# Patient Record
Sex: Female | Born: 1950 | Race: White | Hispanic: No | Marital: Married | Smoking: Never smoker
Health system: Southern US, Community
[De-identification: ages and names within clinical notes are randomized; demographics above are authoritative.]

## PROBLEM LIST (undated history)

## (undated) DIAGNOSIS — F32A Depression, unspecified: Secondary | ICD-10-CM

## (undated) DIAGNOSIS — R112 Nausea with vomiting, unspecified: Secondary | ICD-10-CM

## (undated) DIAGNOSIS — Z87442 Personal history of urinary calculi: Secondary | ICD-10-CM

## (undated) DIAGNOSIS — T8859XA Other complications of anesthesia, initial encounter: Secondary | ICD-10-CM

## (undated) DIAGNOSIS — J45909 Unspecified asthma, uncomplicated: Secondary | ICD-10-CM

## (undated) DIAGNOSIS — C50919 Malignant neoplasm of unspecified site of unspecified female breast: Secondary | ICD-10-CM

## (undated) DIAGNOSIS — E039 Hypothyroidism, unspecified: Secondary | ICD-10-CM

## (undated) DIAGNOSIS — F329 Major depressive disorder, single episode, unspecified: Secondary | ICD-10-CM

## (undated) DIAGNOSIS — K219 Gastro-esophageal reflux disease without esophagitis: Secondary | ICD-10-CM

## (undated) DIAGNOSIS — T4145XA Adverse effect of unspecified anesthetic, initial encounter: Secondary | ICD-10-CM

## (undated) DIAGNOSIS — Z9889 Other specified postprocedural states: Secondary | ICD-10-CM

## (undated) DIAGNOSIS — F419 Anxiety disorder, unspecified: Secondary | ICD-10-CM

## (undated) HISTORY — PX: BREAST EXCISIONAL BIOPSY: SUR124

## (undated) HISTORY — PX: COLONOSCOPY: SHX174

---
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
## 1999-07-10 HISTORY — PX: BREAST BIOPSY: SHX20

## 2004-12-11 ENCOUNTER — Emergency Department: Payer: Self-pay | Admitting: Emergency Medicine

## 2004-12-11 ENCOUNTER — Other Ambulatory Visit: Payer: Self-pay

## 2008-07-19 ENCOUNTER — Ambulatory Visit: Payer: Self-pay | Admitting: Obstetrics and Gynecology

## 2008-11-19 ENCOUNTER — Ambulatory Visit: Payer: Self-pay | Admitting: Gastroenterology

## 2010-09-13 ENCOUNTER — Ambulatory Visit: Payer: Self-pay | Admitting: Cardiology

## 2011-03-10 ENCOUNTER — Inpatient Hospital Stay: Payer: Self-pay | Admitting: Specialist

## 2011-05-17 ENCOUNTER — Ambulatory Visit: Payer: Self-pay | Admitting: Obstetrics and Gynecology

## 2011-09-24 ENCOUNTER — Emergency Department: Payer: Self-pay | Admitting: Emergency Medicine

## 2011-09-24 LAB — BASIC METABOLIC PANEL
Anion Gap: 10 (ref 7–16)
Chloride: 108 mmol/L — ABNORMAL HIGH (ref 98–107)
Co2: 22 mmol/L (ref 21–32)
Creatinine: 0.83 mg/dL (ref 0.60–1.30)
EGFR (African American): >60
Sodium: 140 mmol/L (ref 136–145)

## 2011-09-24 LAB — URINALYSIS, COMPLETE
Bacteria: NONE SEEN
Bilirubin,UR: NEGATIVE
Glucose,UR: NEGATIVE mg/dL (ref 0–75)
Hyaline Cast: 1
Nitrite: NEGATIVE
Protein: NEGATIVE
RBC,UR: 23 /HPF (ref 0–5)
Specific Gravity: 1.013 (ref 1.003–1.030)
Squamous Epithelial: NONE SEEN
WBC UR: 2 /HPF (ref 0–5)

## 2011-09-24 LAB — CBC
HGB: 13.6 g/dL (ref 12.0–16.0)
MCH: 29 pg (ref 26.0–34.0)
MCHC: 33.4 g/dL (ref 32.0–36.0)
MCV: 87 fL (ref 80–100)
RDW: 14.7 % — ABNORMAL HIGH (ref 11.5–14.5)

## 2011-09-28 ENCOUNTER — Emergency Department: Payer: Self-pay | Admitting: Unknown Physician Specialty

## 2011-09-28 LAB — CBC
HGB: 13.1 g/dL (ref 12.0–16.0)
MCH: 29.4 pg (ref 26.0–34.0)
MCHC: 33.9 g/dL (ref 32.0–36.0)
MCV: 87 fL (ref 80–100)
Platelet: 283 10*3/uL (ref 150–440)
RBC: 4.46 10*6/uL (ref 3.80–5.20)
WBC: 12.5 10*3/uL — ABNORMAL HIGH (ref 3.6–11.0)

## 2011-09-28 LAB — URINALYSIS, COMPLETE
Bilirubin,UR: NEGATIVE
Leukocyte Esterase: NEGATIVE
Ph: 5 (ref 4.5–8.0)
Protein: 30
RBC,UR: 725 /HPF (ref 0–5)
Specific Gravity: 1.029 (ref 1.003–1.030)
Squamous Epithelial: <1

## 2011-09-29 LAB — COMPREHENSIVE METABOLIC PANEL
Albumin: 4.3 g/dL (ref 3.4–5.0)
Alkaline Phosphatase: 86 U/L (ref 50–136)
BUN: 15 mg/dL (ref 7–18)
Bilirubin,Total: 0.6 mg/dL (ref 0.2–1.0)
Creatinine: 1.01 mg/dL (ref 0.60–1.30)
Glucose: 126 mg/dL — ABNORMAL HIGH (ref 65–99)
Osmolality: 278 (ref 275–301)
Sodium: 138 mmol/L (ref 136–145)
Total Protein: 7.4 g/dL (ref 6.4–8.2)

## 2011-10-01 ENCOUNTER — Ambulatory Visit: Payer: Self-pay | Admitting: Urology

## 2011-10-04 ENCOUNTER — Ambulatory Visit: Payer: Self-pay | Admitting: Urology

## 2011-10-22 ENCOUNTER — Ambulatory Visit: Payer: Self-pay | Admitting: Urology

## 2011-11-06 ENCOUNTER — Ambulatory Visit: Payer: Self-pay | Admitting: Urology

## 2011-11-20 ENCOUNTER — Ambulatory Visit: Payer: Self-pay | Admitting: Urology

## 2014-07-09 DIAGNOSIS — Z923 Personal history of irradiation: Secondary | ICD-10-CM

## 2014-07-09 DIAGNOSIS — C50919 Malignant neoplasm of unspecified site of unspecified female breast: Secondary | ICD-10-CM

## 2014-07-09 DIAGNOSIS — Z9221 Personal history of antineoplastic chemotherapy: Secondary | ICD-10-CM

## 2014-07-09 HISTORY — DX: Personal history of irradiation: Z92.3

## 2014-07-09 HISTORY — DX: Malignant neoplasm of unspecified site of unspecified female breast: C50.919

## 2014-07-09 HISTORY — DX: Personal history of antineoplastic chemotherapy: Z92.21

## 2014-12-08 ENCOUNTER — Other Ambulatory Visit: Payer: Self-pay | Admitting: Obstetrics and Gynecology

## 2014-12-08 DIAGNOSIS — N63 Unspecified lump in unspecified breast: Secondary | ICD-10-CM

## 2014-12-09 ENCOUNTER — Ambulatory Visit
Admission: RE | Admit: 2014-12-09 | Discharge: 2014-12-09 | Disposition: A | Discharge status: Home / Self Care | Payer: Federal, State, Local not specified - PPO | Source: Ambulatory Visit | Attending: Obstetrics and Gynecology | Admitting: Obstetrics and Gynecology

## 2014-12-09 ENCOUNTER — Other Ambulatory Visit: Payer: Self-pay | Admitting: Obstetrics and Gynecology

## 2014-12-09 ENCOUNTER — Ambulatory Visit: Payer: Self-pay

## 2014-12-09 DIAGNOSIS — N63 Unspecified lump in unspecified breast: Secondary | ICD-10-CM

## 2014-12-09 DIAGNOSIS — R928 Other abnormal and inconclusive findings on diagnostic imaging of breast: Secondary | ICD-10-CM

## 2014-12-10 ENCOUNTER — Other Ambulatory Visit: Payer: Self-pay | Admitting: Obstetrics and Gynecology

## 2014-12-10 ENCOUNTER — Ambulatory Visit
Admission: RE | Admit: 2014-12-10 | Discharge: 2014-12-10 | Disposition: A | Discharge status: Home / Self Care | Payer: Federal, State, Local not specified - PPO | Source: Ambulatory Visit | Attending: Obstetrics and Gynecology | Admitting: Obstetrics and Gynecology

## 2014-12-10 DIAGNOSIS — R928 Other abnormal and inconclusive findings on diagnostic imaging of breast: Secondary | ICD-10-CM

## 2014-12-10 DIAGNOSIS — C50911 Malignant neoplasm of unspecified site of right female breast: Secondary | ICD-10-CM | POA: Diagnosis not present

## 2014-12-10 DIAGNOSIS — N63 Unspecified lump in unspecified breast: Secondary | ICD-10-CM

## 2014-12-20 ENCOUNTER — Ambulatory Visit: Payer: Self-pay | Admitting: General Surgery

## 2014-12-23 LAB — SURGICAL PATHOLOGY

## 2015-06-24 HISTORY — PX: MASTECTOMY PARTIAL / LUMPECTOMY: SUR851

## 2018-03-12 ENCOUNTER — Other Ambulatory Visit: Payer: Self-pay

## 2018-03-12 ENCOUNTER — Encounter: Payer: Self-pay | Admitting: *Deleted

## 2018-03-13 NOTE — Discharge Instructions (Signed)
[facility] °[facility] °ENDOSCOPIC SINUS SURGERY °[facility] ° °What is Functional Endoscopic Sinus Surgery? ° The Surgery involves making the natural openings of the sinuses larger by removing the bony partitions that separate the sinuses from the nasal cavity.  The natural sinus lining is preserved as much as possible to allow the sinuses to resume normal function after the surgery.  In some patients nasal polyps (excessively swollen lining of the sinuses) may be removed to relieve obstruction of the sinus openings.  The surgery is performed through the nose using lighted scopes, which eliminates the need for incisions on the face.  A septoplasty is a different procedure which is sometimes performed with sinus surgery.  It involves straightening the boy partition that separates the two sides of your nose.  A crooked or deviated septum may need repair if is obstructing the sinuses or nasal airflow.  Turbinate reduction is also often performed during sinus surgery.  The turbinates are bony proturberances from the side walls of the nose which swell and can obstruct the nose in patients with sinus and allergy problems.  Their size can be surgically reduced to help relieve nasal obstruction. ° °What Can Sinus Surgery Do For Me? ° Sinus surgery can reduce the frequency of sinus infections requiring antibiotic treatment.  This can provide improvement in nasal congestion, post-nasal drainage, facial pressure and nasal obstruction.  Surgery will NOT prevent you from ever having an infection again, so it usually only for patients who get infections 4 or more times yearly requiring antibiotics, or for infections that do not clear with antibiotics.  It will not cure nasal allergies, so patients with allergies may still require medication to treat their allergies after surgery. Surgery may improve headaches related to sinusitis, however, some people will continue to  require medication to control sinus headaches related to allergies.  Surgery will do nothing for other forms of headache (migraine, tension or cluster). ° °What Are the Risks of Endoscopic Sinus Surgery? ° Current techniques allow surgery to be performed safely with little risk, however, there are rare complications that patients should be aware of.  Because the sinuses are located around the eyes, there is risk of eye injury, including blindness, though again, this would be quite rare. This is usually a result of bleeding behind the eye during surgery, which puts the vision oat risk, though there are treatments to protect the vision and prevent permanent disrupted by surgery causing a leak of the spinal fluid that surrounds the brain.  More serious complications would include bleeding inside the brain cavity or damage to the brain.  Again, all of these complications are uncommon, and spinal fluid leaks can be safely managed surgically if they occur.  The most common complication of sinus surgery is bleeding from the nose, which may require packing or cauterization of the nose.  Continued sinus have polyps may experience recurrence of the polyps requiring revision surgery.  Alterations of sense of smell or injury to the tear ducts are also rare complications.  ° °What is the Surgery Like, and what is the Recovery? ° The Surgery usually takes a couple of hours to perform, and is usually performed under a general anesthetic (completely asleep).  Patients are usually discharged home after a couple of hours.  Sometimes during surgery it is necessary to pack the nose to control bleeding, and the packing is left in place for 24 - 48 hours, and removed by your surgeon.    If a septoplasty was performed during the procedure, there is often a splint placed which must be removed after 5-7 days.   °Discomfort: Pain is usually mild to moderate, and can be controlled by prescription pain medication or acetaminophen (Tylenol).   Aspirin, Ibuprofen (Advil, Motrin), or Naprosyn (Aleve) should be avoided, as they can cause increased bleeding.  Most patients feel sinus pressure like they have a bad head cold for several days.  Sleeping with your head elevated can help reduce swelling and facial pressure, as can ice packs over the face.  A humidifier may be helpful to keep the mucous and blood from drying in the nose.  ° °Diet: There are no specific diet restrictions, however, you should generally start with clear liquids and a light diet of bland foods because the anesthetic can cause some nausea.  Advance your diet depending on how your stomach feels.  Taking your pain medication with food will often help reduce stomach upset which pain medications can cause. ° °Nasal Saline Irrigation: It is important to remove blood clots and dried mucous from the nose as it is healing.  This is done by having you irrigate the nose at least 3 - 4 times daily with a salt water solution.  We recommend using NeilMed Sinus Rinse (available at the drug store).  Fill the squeeze bottle with the solution, bend over a sink, and insert the tip of the squeeze bottle into the nose ½ of an inch.  Point the tip of the squeeze bottle towards the inside corner of the eye on the same side your irrigating.  Squeeze the bottle and gently irrigate the nose.  If you bend forward as you do this, most of the fluid will flow back out of the nose, instead of down your throat.   The solution should be warm, near body temperature, when you irrigate.   Each time you irrigate, you should use a full squeeze bottle.  ° °Note that if you are instructed to use Nasal Steroid Sprays at any time after your surgery, irrigate with saline BEFORE using the steroid spray, so you do not wash it all out of the nose. °Another product, Nasal Saline Gel (such as AYR Nasal Saline Gel) can be applied in each nostril 3 - 4 times daily to moisture the nose and reduce scabbing or crusting. ° °Bleeding:   Bloody drainage from the nose can be expected for several days, and patients are instructed to irrigate their nose frequently with salt water to help remove mucous and blood clots.  The drainage may be dark red or brown, though some fresh blood may be seen intermittently, especially after irrigation.  Do not blow you nose, as bleeding may occur. If you must sneeze, keep your mouth open to allow air to escape through your mouth. ° °If heavy bleeding occurs: Irrigate the nose with saline to rinse out clots, then spray the nose 3 - 4 times with Afrin Nasal Decongestant Spray.  The spray will constrict the blood vessels to slow bleeding.  Pinch the lower half of your nose shut to apply pressure, and lay down with your head elevated.  Ice packs over the nose may help as well. If bleeding persists despite these measures, you should notify your doctor.  Do not use the Afrin routinely to control nasal congestion after surgery, as it can result in worsening congestion and may affect healing.  ° ° ° °Activity: Return to work varies among patients. Most patients will be   out of work at least 5 - 7 days to recover.  Patient may return to work after they are off of narcotic pain medication, and feeling well enough to perform the functions of their job.  Patients must avoid heavy lifting (over 10 pounds) or strenuous physical for 2 weeks after surgery, so your employer may need to assign you to light duty, or keep you out of work longer if light duty is not possible.  NOTE: you should not drive, operate dangerous machinery, do any mentally demanding tasks or make any important legal or financial decisions while on narcotic pain medication and recovering from the general anesthetic.  °  °Call Your Doctor Immediately if You Have Any of the Following: °1. Bleeding that you cannot control with the above measures °2. Loss of vision, double vision, bulging of the eye or black eyes. °3. Fever over 101 degrees °4. Neck stiffness with  severe headache, fever, nausea and change in mental state. °You are always encourage to call anytime with concerns, however, please call with requests for pain medication refills during office hours. ° °Office Endoscopy: During follow-up visits your doctor will remove any packing or splints that may have been placed and evaluate and clean your sinuses endoscopically.  Topical anesthetic will be used to make this as comfortable as possible, though you may want to take your pain medication prior to the visit.  How often this will need to be done varies from patient to patient.  After complete recovery from the surgery, you may need follow-up endoscopy from time to time, particularly if there is concern of recurrent infection or nasal polyps. ° ° °General Anesthesia, Adult, Care After °These instructions provide you with information about caring for yourself after your procedure. Your health care provider may also give you more specific instructions. Your treatment has been planned according to current medical practices, but problems sometimes occur. Call your health care provider if you have any problems or questions after your procedure. °What can I expect after the procedure? °After the procedure, it is common to have: °· Vomiting. °· A sore throat. °· Mental slowness. ° °It is common to feel: °· Nauseous. °· Cold or shivery. °· Sleepy. °· Tired. °· Sore or achy, even in parts of your body where you did not have surgery. ° °Follow these instructions at home: °For at least 24 hours after the procedure: °· Do not: °? Participate in activities where you could fall or become injured. °? Drive. °? Use heavy machinery. °? Drink alcohol. °? Take sleeping pills or medicines that cause drowsiness. °? Make important decisions or sign legal documents. °? Take care of children on your own. °· Rest. °Eating and drinking °· If you vomit, drink water, juice, or soup when you can drink without vomiting. °· Drink enough fluid to  keep your urine clear or pale yellow. °· Make sure you have little or no nausea before eating solid foods. °· Follow the diet recommended by your health care provider. °General instructions °· Have a responsible adult stay with you until you are awake and alert. °· Return to your normal activities as told by your health care provider. Ask your health care provider what activities are safe for you. °· Take over-the-counter and prescription medicines only as told by your health care provider. °· If you smoke, do not smoke without supervision. °· Keep all follow-up visits as told by your health care provider. This is important. °Contact a health care provider if: °· You   continue to have nausea or vomiting at home, and medicines are not helpful. °· You cannot drink fluids or start eating again. °· You cannot urinate after 8-12 hours. °· You develop a skin rash. °· You have fever. °· You have increasing redness at the site of your procedure. °Get help right away if: °· You have difficulty breathing. °· You have chest pain. °· You have unexpected bleeding. °· You feel that you are having a life-threatening or urgent problem. °This information is not intended to replace advice given to you by your health care provider. Make sure you discuss any questions you have with your health care provider. °Document Released: 10/01/2000 Document Revised: 11/28/2015 Document Reviewed: 06/09/2015 °Elsevier Interactive Patient Education © 2018 [employer] ° °

## 2018-03-14 ENCOUNTER — Ambulatory Visit: Payer: Medicare HMO | Admitting: Anesthesiology

## 2018-03-14 ENCOUNTER — Encounter
Admission: RE | Disposition: A | Discharge status: Home / Self Care | Payer: Self-pay | Source: Ambulatory Visit | Attending: Unknown Physician Specialty

## 2018-03-14 ENCOUNTER — Ambulatory Visit
Admission: RE | Admit: 2018-03-14 | Discharge: 2018-03-14 | Disposition: A | Discharge status: Home / Self Care | Payer: Medicare HMO | Source: Ambulatory Visit | Attending: Unknown Physician Specialty | Admitting: Unknown Physician Specialty

## 2018-03-14 DIAGNOSIS — Z853 Personal history of malignant neoplasm of breast: Secondary | ICD-10-CM | POA: Insufficient documentation

## 2018-03-14 DIAGNOSIS — J342 Deviated nasal septum: Secondary | ICD-10-CM | POA: Insufficient documentation

## 2018-03-14 DIAGNOSIS — J343 Hypertrophy of nasal turbinates: Secondary | ICD-10-CM | POA: Diagnosis not present

## 2018-03-14 DIAGNOSIS — Z79899 Other long term (current) drug therapy: Secondary | ICD-10-CM | POA: Insufficient documentation

## 2018-03-14 DIAGNOSIS — J3489 Other specified disorders of nose and nasal sinuses: Secondary | ICD-10-CM | POA: Diagnosis not present

## 2018-03-14 DIAGNOSIS — E039 Hypothyroidism, unspecified: Secondary | ICD-10-CM | POA: Diagnosis not present

## 2018-03-14 DIAGNOSIS — Z882 Allergy status to sulfonamides status: Secondary | ICD-10-CM | POA: Insufficient documentation

## 2018-03-14 HISTORY — DX: Malignant neoplasm of unspecified site of unspecified female breast: C50.919

## 2018-03-14 HISTORY — PX: NASAL SEPTOPLASTY W/ TURBINOPLASTY: SHX2070

## 2018-03-14 HISTORY — DX: Hypothyroidism, unspecified: E03.9

## 2018-03-14 SURGERY — SEPTOPLASTY, NOSE, WITH NASAL TURBINATE REDUCTION
Anesthesia: General | Site: Nose | Wound class: "Clean Contaminated "

## 2018-03-14 MED ORDER — EPHEDRINE SULFATE 50 MG/ML IJ SOLN
INTRAMUSCULAR | Status: DC | PRN
  Administered 2018-03-14: 5 mg via INTRAVENOUS
  Administered 2018-03-14: 10 mg via INTRAVENOUS

## 2018-03-14 MED ORDER — BACITRACIN 500 UNIT/GM EX OINT
TOPICAL_OINTMENT | CUTANEOUS | Status: DC | PRN
  Administered 2018-03-14: 2 via TOPICAL

## 2018-03-14 MED ORDER — ACETAMINOPHEN 325 MG PO TABS: 325.0000 mg | ORAL_TABLET | ORAL | Status: DC | PRN

## 2018-03-14 MED ORDER — OXYCODONE HCL 5 MG PO TABS: 5.0000 mg | ORAL_TABLET | Freq: Once | ORAL | Status: DC | PRN

## 2018-03-14 MED ORDER — DEXAMETHASONE SODIUM PHOSPHATE 4 MG/ML IJ SOLN
INTRAMUSCULAR | Status: DC | PRN
  Administered 2018-03-14: 10 mg via INTRAVENOUS

## 2018-03-14 MED ORDER — ONDANSETRON HCL 4 MG/2ML IJ SOLN
INTRAMUSCULAR | Status: DC | PRN
  Administered 2018-03-14: 4 mg via INTRAVENOUS

## 2018-03-14 MED ORDER — MIDAZOLAM HCL 5 MG/5ML IJ SOLN
INTRAMUSCULAR | Status: DC | PRN
  Administered 2018-03-14: 1 mg via INTRAVENOUS

## 2018-03-14 MED ORDER — PHENYLEPHRINE HCL 10 MG/ML IJ SOLN
INTRAMUSCULAR | Status: DC | PRN
  Administered 2018-03-14: 50 ug via INTRAVENOUS

## 2018-03-14 MED ORDER — HYDROMORPHONE HCL 1 MG/ML IJ SOLN: 0.2500 mg | INTRAMUSCULAR | Status: DC | PRN

## 2018-03-14 MED ORDER — LIDOCAINE-EPINEPHRINE 1 %-1:100000 IJ SOLN
INTRAMUSCULAR | Status: DC | PRN
  Administered 2018-03-14: 6 mL

## 2018-03-14 MED ORDER — FENTANYL CITRATE (PF) 100 MCG/2ML IJ SOLN
INTRAMUSCULAR | Status: DC | PRN
  Administered 2018-03-14: 25 ug via INTRAVENOUS
  Administered 2018-03-14: 50 ug via INTRAVENOUS

## 2018-03-14 MED ORDER — OXYMETAZOLINE HCL 0.05 % NA SOLN
6.0000 | Freq: Once | NASAL | Status: AC
  Administered 2018-03-14: 6 via NASAL

## 2018-03-14 MED ORDER — CLINDAMYCIN HCL 300 MG PO CAPS: 300.0000 mg | ORAL_CAPSULE | Freq: Three times a day (TID) | ORAL | 0 refills | Status: DC

## 2018-03-14 MED ORDER — OXYCODONE HCL 5 MG/5ML PO SOLN: 5.0000 mg | Freq: Once | ORAL | Status: DC | PRN

## 2018-03-14 MED ORDER — ACETAMINOPHEN 160 MG/5ML PO SOLN: 325.0000 mg | ORAL | Status: DC | PRN

## 2018-03-14 MED ORDER — ONDANSETRON HCL 4 MG/2ML IJ SOLN: 4.0000 mg | Freq: Once | INTRAMUSCULAR | Status: DC | PRN

## 2018-03-14 MED ORDER — LIDOCAINE HCL (CARDIAC) PF 100 MG/5ML IV SOSY
PREFILLED_SYRINGE | INTRAVENOUS | Status: DC | PRN
  Administered 2018-03-14: 40 mg via INTRAVENOUS

## 2018-03-14 MED ORDER — OXYCODONE-ACETAMINOPHEN 10-325 MG PO TABS: 1.0000 | ORAL_TABLET | Freq: Four times a day (QID) | ORAL | 0 refills | Status: AC | PRN

## 2018-03-14 MED ORDER — ACETAMINOPHEN 10 MG/ML IV SOLN
1000.0000 mg | Freq: Once | INTRAVENOUS | Status: AC
  Administered 2018-03-14: 1000 mg via INTRAVENOUS

## 2018-03-14 MED ORDER — LACTATED RINGERS IV SOLN
INTRAVENOUS | Status: DC
  Administered 2018-03-14: 07:00:00 via INTRAVENOUS

## 2018-03-14 MED ORDER — GLYCOPYRROLATE 0.2 MG/ML IJ SOLN
INTRAMUSCULAR | Status: DC | PRN
  Administered 2018-03-14: 0.1 mg via INTRAVENOUS

## 2018-03-14 MED ORDER — PHENYLEPHRINE HCL 0.5 % NA SOLN
NASAL | Status: DC | PRN
  Administered 2018-03-14: 30 mL via TOPICAL

## 2018-03-14 MED ORDER — ONDANSETRON 4 MG PO TBDP
4.0000 mg | ORAL_TABLET | Freq: Once | ORAL | Status: AC
  Administered 2018-03-14: 4 mg via ORAL

## 2018-03-14 MED ORDER — PROPOFOL 10 MG/ML IV BOLUS
INTRAVENOUS | Status: DC | PRN
  Administered 2018-03-14: 120 mg via INTRAVENOUS
  Administered 2018-03-14: 20 mg via INTRAVENOUS

## 2018-03-14 MED ORDER — SUCCINYLCHOLINE CHLORIDE 20 MG/ML IJ SOLN
INTRAMUSCULAR | Status: DC | PRN
  Administered 2018-03-14: 80 mg via INTRAVENOUS

## 2018-03-14 SURGICAL SUPPLY — 27 items
COAG SUCT 10F 3.5MM HAND CTRL (MISCELLANEOUS) ×3 IMPLANT
DRAPE HEAD BAR (DRAPES) ×3 IMPLANT
DRESSING NASL FOAM PST OP SINU (MISCELLANEOUS) IMPLANT
DRSG NASAL FOAM POST OP SINU (MISCELLANEOUS)
ELECT REM PT RETURN 9FT ADLT (ELECTROSURGICAL) ×3
ELECTRODE REM PT RTRN 9FT ADLT (ELECTROSURGICAL) ×1 IMPLANT
GLOVE BIO SURGEON STRL SZ7.5 (GLOVE) ×6 IMPLANT
HANDLE YANKAUER SUCT BULB TIP (MISCELLANEOUS) ×3 IMPLANT
KIT TURNOVER KIT A (KITS) ×3 IMPLANT
NDL HYPO 25GX1X1/2 BEV (NEEDLE) ×1 IMPLANT
NEEDLE HYPO 25GX1X1/2 BEV (NEEDLE) ×3 IMPLANT
PACK DRAPE NASAL/ENT (PACKS) ×3 IMPLANT
SPLINT NASAL SEPTAL BLV .25 LG (MISCELLANEOUS) IMPLANT
SPLINT NASAL SEPTAL BLV .50 ST (MISCELLANEOUS) ×2 IMPLANT
SPONGE NEURO XRAY DETECT 1X3 (DISPOSABLE) ×3 IMPLANT
STRAP BODY AND KNEE 60X3 (MISCELLANEOUS) ×3 IMPLANT
SUT CHROMIC 3-0 (SUTURE) ×2
SUT CHROMIC 3-0 KS 27XMFL CR (SUTURE) ×1
SUT CHROMIC 5-0 (SUTURE)
SUT CHROMIC 5-0 P2 18XMFL CR (SUTURE)
SUT ETHILON 3-0 KS 30 BLK (SUTURE) ×3 IMPLANT
SUT PLAIN GUT 4-0 (SUTURE) IMPLANT
SUTURE CHRMC 3-0 KS 27XMFL CR (SUTURE) ×1 IMPLANT
SUTURE CHRMC 5-0 P2 18XMF CR (SUTURE) IMPLANT
SYR 10ML LL (SYRINGE) ×3 IMPLANT
TOWEL OR 17X26 4PK STRL BLUE (TOWEL DISPOSABLE) ×3 IMPLANT
WATER STERILE IRR 250ML POUR (IV SOLUTION) ×3 IMPLANT

## 2018-03-14 NOTE — Op Note (Signed)
PREOPERATIVE DIAGNOSIS:  Chronic nasal obstruction.  POSTOPERATIVE DIAGNOSIS:  Chronic nasal obstruction.  SURGEON:  Roena Malady, M.D.  NAME OF PROCEDURE:  1. Nasal septoplasty. 2. Submucous resection of inferior turbinates.  OPERATIVE FINDINGS:  Severe nasal septal deformity, hypertrophy of the inferior turbinates.   DESCRIPTION OF THE PROCEDURE:  Carol Kirby was identified in the holding area and taken to the operating room and placed in the supine position.  After general endotracheal anesthesia was induced, the table was turned 45 degrees and the patient was placed in a semi-Fowler position.  The nose was then topically anesthetized with Lidocaine, cotton pledgets were placed within each nostril. After approximately 5 minutes, this was removed at which time a local anesthetic of 1% Lidocaine 1:100,000 units of Epinephrine was used to inject the inferior turbinates in the nasal septum. A total of 12 ml was used. Examination of the nose showed a severe left nasal septal deformity and tremendous hypertrophied inferior turbinate.  Beginning on the right hand side a hemitransfixion incision was then created on the leading edge of the septum on the right.  A subperichondrial plane was elevated posteriorly on the left and taken back to the perpendicular plate of the ethmoid where subperiosteal plane was elevated posteriorly on the left. A large septal spur was identified on the left hand side impacting on the inferior turbinate.  An inferior rim of cartilage was removed anteriorly with care taken to leave an anterior strut to prevent nasal collapse. With this strut removed the perpendicular plate of the ethmoid was separated from the quadrangular cartilage. The large septal spur was removed.  The septum was then replaced in the midline. Reinspection through each nostril showed excellent reduction of the septal deformity. A left posterior inferior fenestration was then created to allow hematoma  drainage.  With the septoplasty completed, beginning on the left-hand side, a 15 blade was used to incise along the inferior edge of the inferior turbinate. A superior laterally based flap was then elevated. The underlying conchal bone of mucosa was excised using Knight scissors. The flap was then laid back over the turbinate stump and cauterized using suction cautery. In a similar fashion the submucous resection was performed on the right.  With the submucous resection completed bilaterally and no active bleeding, the hemitransfixion incision was then closed using two interrupted 3-0 chromic sutures.  Plastic nasal septal splints were placed within each nostril and affixed to the septum using a 3-0 nylon suture. Stammberger was then used beneath each inferior turbinate for hemostasis.    The patient tolerated the procedure well, was returned to anesthesia, extubated in the operating room, and taken to the recovery room in stable condition.    CULTURES:  None.  SPECIMENS:  None.  ESTIMATED BLOOD LOSS:  25 cc.  Roena Malady  03/14/2018  8:49 AM

## 2018-03-14 NOTE — Addendum Note (Signed)
Addendum  created 03/14/18 4834 by Rochel Brome, MD   Order list changed

## 2018-03-14 NOTE — Anesthesia Postprocedure Evaluation (Signed)
Anesthesia Post Note  Patient: Carol Kirby  Procedure(s) Performed: NASAL SEPTOPLASTY WITH TURBINATE RESECTION (N/A Nose)  Patient location during evaluation: PACU Anesthesia Type: General Level of consciousness: awake and alert Pain management: pain level controlled Vital Signs Assessment: post-procedure vital signs reviewed and stable Respiratory status: spontaneous breathing, nonlabored ventilation, respiratory function stable and patient connected to nasal cannula oxygen Cardiovascular status: blood pressure returned to baseline and stable Postop Assessment: no apparent nausea or vomiting Anesthetic complications: no    Trecia Rogers

## 2018-03-14 NOTE — Anesthesia Procedure Notes (Signed)
Procedure Name: Intubation Date/Time: 03/14/2018 8:03 AM Performed by: Mayme Genta, CRNA Pre-anesthesia Checklist: Patient identified, Emergency Drugs available, Suction available, Patient being monitored and Timeout performed Patient Re-evaluated:Patient Re-evaluated prior to induction Oxygen Delivery Method: Circle system utilized Preoxygenation: Pre-oxygenation with 100% oxygen Induction Type: IV induction Ventilation: Mask ventilation without difficulty Laryngoscope Size: Miller and 2 Grade View: Grade I Tube type: Oral Rae Tube size: 7.0 mm Number of attempts: 1 Placement Confirmation: ETT inserted through vocal cords under direct vision,  positive ETCO2 and breath sounds checked- equal and bilateral Tube secured with: Tape Dental Injury: Teeth and Oropharynx as per pre-operative assessment

## 2018-03-14 NOTE — Anesthesia Preprocedure Evaluation (Signed)
Anesthesia Evaluation  Patient identified by MRN, date of birth, ID band Patient awake    Reviewed: Allergy & Precautions, H&P , NPO status , Patient's Chart, lab work & pertinent test results, reviewed documented beta blocker date and time   Airway Mallampati: I  TM Distance: >3 FB Neck ROM: full    Dental no notable dental hx.    Pulmonary neg pulmonary ROS,    Pulmonary exam normal breath sounds clear to auscultation       Cardiovascular Exercise Tolerance: Good negative cardio ROS Normal cardiovascular exam Rhythm:regular Rate:Normal     Neuro/Psych negative neurological ROS  negative psych ROS   GI/Hepatic negative GI ROS, Neg liver ROS,   Endo/Other  Hypothyroidism   Renal/GU negative Renal ROS  negative genitourinary   Musculoskeletal   Abdominal   Peds  Hematology Hx of breast cancer, s/p partial mastectomy   Anesthesia Other Findings   Reproductive/Obstetrics negative OB ROS                             Anesthesia Physical Anesthesia Plan  ASA: II  Anesthesia Plan: General   Post-op Pain Management:    Induction:   PONV Risk Score and Plan: Ondansetron and Dexamethasone  Airway Management Planned:   Additional Equipment:   Intra-op Plan:   Post-operative Plan:   Informed Consent: I have reviewed the patients History and Physical, chart, labs and discussed the procedure including the risks, benefits and alternatives for the proposed anesthesia with the patient or authorized representative who has indicated his/her understanding and acceptance.   Dental Advisory Given  Plan Discussed with: CRNA  Anesthesia Plan Comments:         Anesthesia Quick Evaluation

## 2018-03-14 NOTE — Transfer of Care (Signed)
Immediate Anesthesia Transfer of Care Note  Patient: Carol Kirby  Procedure(s) Performed: NASAL SEPTOPLASTY WITH TURBINATE RESECTION (N/A Nose)  Patient Location: PACU  Anesthesia Type: General  Level of Consciousness: awake, alert  and patient cooperative  Airway and Oxygen Therapy: Patient Spontanous Breathing and Patient connected to supplemental oxygen  Post-op Assessment: Post-op Vital signs reviewed, Patient's Cardiovascular Status Stable, Respiratory Function Stable, Patent Airway and No signs of Nausea or vomiting  Post-op Vital Signs: Reviewed and stable  Complications: No apparent anesthesia complications

## 2018-03-14 NOTE — H&P (Signed)
The patient's history has been reviewed, patient examined, no change in status, stable for surgery.  Questions were answered to the patients satisfaction.  

## 2019-03-02 ENCOUNTER — Other Ambulatory Visit
Admission: RE | Admit: 2019-03-02 | Discharge: 2019-03-02 | Disposition: A | Discharge status: Home / Self Care | Payer: Medicare HMO | Source: Ambulatory Visit | Attending: Gastroenterology | Admitting: Gastroenterology

## 2019-03-02 ENCOUNTER — Other Ambulatory Visit: Payer: Self-pay

## 2019-03-02 DIAGNOSIS — Z01812 Encounter for preprocedural laboratory examination: Secondary | ICD-10-CM | POA: Diagnosis not present

## 2019-03-02 DIAGNOSIS — Z20828 Contact with and (suspected) exposure to other viral communicable diseases: Secondary | ICD-10-CM | POA: Insufficient documentation

## 2019-03-02 LAB — SARS CORONAVIRUS 2 (TAT 6-24 HRS): SARS Coronavirus 2: NEGATIVE

## 2019-03-05 ENCOUNTER — Ambulatory Visit: Payer: Medicare HMO | Admitting: Anesthesiology

## 2019-03-05 ENCOUNTER — Ambulatory Visit
Admission: RE | Admit: 2019-03-05 | Discharge: 2019-03-05 | Disposition: A | Discharge status: Home / Self Care | Payer: Medicare HMO | Attending: Gastroenterology | Admitting: Gastroenterology

## 2019-03-05 ENCOUNTER — Encounter
Admission: RE | Disposition: A | Discharge status: Home / Self Care | Payer: Self-pay | Source: Home / Self Care | Attending: Gastroenterology

## 2019-03-05 ENCOUNTER — Other Ambulatory Visit: Payer: Self-pay

## 2019-03-05 DIAGNOSIS — E039 Hypothyroidism, unspecified: Secondary | ICD-10-CM | POA: Insufficient documentation

## 2019-03-05 DIAGNOSIS — Z882 Allergy status to sulfonamides status: Secondary | ICD-10-CM | POA: Diagnosis not present

## 2019-03-05 DIAGNOSIS — Z86010 Personal history of colonic polyps: Secondary | ICD-10-CM | POA: Insufficient documentation

## 2019-03-05 DIAGNOSIS — K5730 Diverticulosis of large intestine without perforation or abscess without bleeding: Secondary | ICD-10-CM | POA: Diagnosis not present

## 2019-03-05 DIAGNOSIS — K602 Anal fissure, unspecified: Secondary | ICD-10-CM | POA: Diagnosis not present

## 2019-03-05 DIAGNOSIS — Z79890 Hormone replacement therapy (postmenopausal): Secondary | ICD-10-CM | POA: Diagnosis not present

## 2019-03-05 DIAGNOSIS — D120 Benign neoplasm of cecum: Secondary | ICD-10-CM | POA: Diagnosis not present

## 2019-03-05 DIAGNOSIS — Z853 Personal history of malignant neoplasm of breast: Secondary | ICD-10-CM | POA: Insufficient documentation

## 2019-03-05 DIAGNOSIS — J45909 Unspecified asthma, uncomplicated: Secondary | ICD-10-CM | POA: Diagnosis not present

## 2019-03-05 DIAGNOSIS — Z87442 Personal history of urinary calculi: Secondary | ICD-10-CM | POA: Insufficient documentation

## 2019-03-05 DIAGNOSIS — Z1211 Encounter for screening for malignant neoplasm of colon: Secondary | ICD-10-CM | POA: Diagnosis not present

## 2019-03-05 DIAGNOSIS — Z79899 Other long term (current) drug therapy: Secondary | ICD-10-CM | POA: Diagnosis not present

## 2019-03-05 HISTORY — PX: COLONOSCOPY WITH PROPOFOL: SHX5780

## 2019-03-05 HISTORY — DX: Depression, unspecified: F32.A

## 2019-03-05 HISTORY — DX: Unspecified asthma, uncomplicated: J45.909

## 2019-03-05 HISTORY — DX: Nausea with vomiting, unspecified: R11.2

## 2019-03-05 HISTORY — DX: Anxiety disorder, unspecified: F41.9

## 2019-03-05 HISTORY — DX: Other complications of anesthesia, initial encounter: T88.59XA

## 2019-03-05 HISTORY — DX: Personal history of urinary calculi: Z87.442

## 2019-03-05 HISTORY — DX: Nausea with vomiting, unspecified: Z98.890

## 2019-03-05 SURGERY — COLONOSCOPY WITH PROPOFOL
Anesthesia: General

## 2019-03-05 MED ORDER — LIDOCAINE HCL (PF) 2 % IJ SOLN
INTRAMUSCULAR | Status: AC
  Filled 2019-03-05: qty 10

## 2019-03-05 MED ORDER — SODIUM CHLORIDE 0.9 % IV SOLN
INTRAVENOUS | Status: DC
  Administered 2019-03-05: 07:00:00 via INTRAVENOUS

## 2019-03-05 MED ORDER — PROPOFOL 10 MG/ML IV BOLUS
INTRAVENOUS | Status: DC | PRN
  Administered 2019-03-05 (×2): 20 mg via INTRAVENOUS

## 2019-03-05 MED ORDER — PROPOFOL 500 MG/50ML IV EMUL
INTRAVENOUS | Status: AC
  Filled 2019-03-05: qty 50

## 2019-03-05 MED ORDER — LIDOCAINE HCL (CARDIAC) PF 100 MG/5ML IV SOSY
PREFILLED_SYRINGE | INTRAVENOUS | Status: DC | PRN
  Administered 2019-03-05: 40 mg via INTRAVENOUS

## 2019-03-05 MED ORDER — PROPOFOL 500 MG/50ML IV EMUL
INTRAVENOUS | Status: DC | PRN
  Administered 2019-03-05: 40 ug/kg/min via INTRAVENOUS

## 2019-03-05 NOTE — Anesthesia Postprocedure Evaluation (Signed)
Anesthesia Post Note  Patient: Carol Kirby  Procedure(s) Performed: COLONOSCOPY WITH PROPOFOL (N/A )  Patient location during evaluation: Endoscopy Anesthesia Type: General Level of consciousness: awake and alert Pain management: pain level controlled Vital Signs Assessment: post-procedure vital signs reviewed and stable Respiratory status: spontaneous breathing, nonlabored ventilation, respiratory function stable and patient connected to nasal cannula oxygen Cardiovascular status: blood pressure returned to baseline and stable Postop Assessment: no apparent nausea or vomiting Anesthetic complications: no     Last Vitals:  Vitals:   03/05/19 0822 03/05/19 0842  BP: 117/69 140/78  Pulse: 61   Resp: 15   Temp:    SpO2: 98%     Last Pain:  Vitals:   03/05/19 0822  TempSrc:   PainSc: 0-No pain                 [place] [facility]

## 2019-03-05 NOTE — H&P (Signed)
Outpatient short stay form Pre-procedure 03/05/2019 7:42 AM Carol Sails MD  Primary Physician: Dr. Belinda Fisher  Reason for visit: Colonoscopy  History of present illness: Patient is a 68 year old female presenting today for colonoscopy in regards to colon cancer screening.  Her last colonoscopy was 11/19/2008- at that time for colon polyps.  He tolerated her prep well.  She takes no aspirin or blood thinning agent.    Current Facility-Administered Medications:  .  0.9 %  sodium chloride infusion, , Intravenous, Continuous, Carol Sails, MD, Last Rate: 20 mL/hr at 03/05/19 0720  Medications Prior to Admission  Medication Sig Dispense Refill Last Dose  . cyanocobalamin (,VITAMIN B-12,) 1000 MCG/ML injection Inject 1,000 mcg into the muscle every 30 (thirty) days.   Past Month at Unknown time  . folic acid (FOLVITE) 1 MG tablet Take 1 mg by mouth daily.   03/04/2019 at Unknown time  . levothyroxine (SYNTHROID, LEVOTHROID) 100 MCG tablet Take 100 mcg by mouth daily before breakfast.   03/04/2019 at Unknown time  . simvastatin (ZOCOR) 20 MG tablet Take 20 mg by mouth daily.   03/04/2019 at Unknown time  . Vitamin D, Ergocalciferol, (DRISDOL) 50000 units CAPS capsule Take 50,000 Units by mouth every 30 (thirty) days.   Past Month at Unknown time  . clindamycin (CLEOCIN) 300 MG capsule Take 1 capsule (300 mg total) by mouth 3 (three) times daily. (Patient not taking: Reported on 03/05/2019) 30 capsule 0 Completed Course at Unknown time  . montelukast (SINGULAIR) 10 MG tablet Take 10 mg by mouth daily.   Not Taking at Unknown time  . oxyCODONE-acetaminophen (PERCOCET) 10-325 MG tablet Take 1 tablet by mouth every 6 (six) hours as needed for pain. (Patient not taking: Reported on 03/05/2019) 30 tablet 0 Not Taking at Unknown time  . ranitidine (ZANTAC) 150 MG tablet Take 150 mg by mouth daily as needed for heartburn.   Not Taking at Unknown time     Allergies  Allergen Reactions  .  Sulfa Antibiotics Rash     Past Medical History:  Diagnosis Date  . Anxiety   . Asthma   . Breast CA ([place]) 2016   right  . Complication of anesthesia   . Depression   . History of kidney stones   . Hypothyroidism   . PONV (postoperative nausea and vomiting)     Review of systems:      Physical Exam    Heart and lungs: Regular rate and rhythm without rub or gallop lungs are bilaterally clear normocephalic atraumatic eyes are anicteric    HEENT: Normocephalic atraumatic eyes are anicteric    Other:    Pertinant exam for procedure: Soft nontender nondistended bowel sounds positive normoactive    Planned proceedures: Colonoscopy and indicated procedures. I have discussed the risks benefits and complications of procedures to include not limited to bleeding, infection, perforation and the risk of sedation and the patient wishes to proceed.    Carol Sails, MD Gastroenterology 03/05/2019  7:42 AM

## 2019-03-05 NOTE — Transfer of Care (Signed)
Immediate Anesthesia Transfer of Care Note  Patient: Carol Kirby  Procedure(s) Performed: COLONOSCOPY WITH PROPOFOL (N/A )  Patient Location: PACU  Anesthesia Type:General  Level of Consciousness: sedated  Airway & Oxygen Therapy: Patient Spontanous Breathing and Patient connected to nasal cannula oxygen  Post-op Assessment: Report given to RN and Post -op Vital signs reviewed and stable  Post vital signs: Reviewed and stable  Last Vitals:  Vitals Value Taken Time  BP    Temp    Pulse 70 03/05/19 0812  Resp 12 03/05/19 0812  SpO2 97 % 03/05/19 0812  Vitals shown include unvalidated device data.  Last Pain: There were no vitals filed for this visit.       Complications: No apparent anesthesia complications

## 2019-03-05 NOTE — Anesthesia Preprocedure Evaluation (Addendum)
Anesthesia Evaluation  Patient identified by MRN, date of birth, ID band Patient awake    Reviewed: Allergy & Precautions, NPO status , Patient's Chart, lab work & pertinent test results, reviewed documented beta blocker date and time   History of Anesthesia Complications (+) PONV and history of anesthetic complications  Airway Mallampati: II  TM Distance: >3 FB     Dental  (+) Chipped   Pulmonary asthma ,           Cardiovascular      Neuro/Psych PSYCHIATRIC DISORDERS Anxiety Depression    GI/Hepatic   Endo/Other  Hypothyroidism   Renal/GU      Musculoskeletal   Abdominal   Peds  Hematology   Anesthesia Other Findings   Reproductive/Obstetrics                            Anesthesia Physical Anesthesia Plan  ASA: III  Anesthesia Plan: General   Post-op Pain Management:    Induction: Intravenous  PONV Risk Score and Plan:   Airway Management Planned:   Additional Equipment:   Intra-op Plan:   Post-operative Plan:   Informed Consent: I have reviewed the patients History and Physical, chart, labs and discussed the procedure including the risks, benefits and alternatives for the proposed anesthesia with the patient or authorized representative who has indicated his/her understanding and acceptance.       Plan Discussed with: CRNA  Anesthesia Plan Comments:        Anesthesia Quick Evaluation

## 2019-03-05 NOTE — Op Note (Signed)
[facility] Gastroenterology Patient Name: Carol Kirby Procedure Date: 03/05/2019 7:46 AM MRN: [id number] Account #: [id number] Date of Birth: 12-24-1950 Admit Type: Outpatient Age: 68 Room: [facility] ENDO ROOM 3 Gender: Female Note Status: Finalized Procedure:            Colonoscopy Indications:          Screening for colorectal malignant neoplasm Providers:            Lollie Sails, MD Medicines:            Monitored Anesthesia Care Complications:        No immediate complications. Procedure:            Pre-Anesthesia Assessment:                       - ASA Grade Assessment: III - A patient with severe                        systemic disease.                       After obtaining informed consent, the colonoscope was                        passed under direct vision. Throughout the procedure,                        the patient's blood pressure, pulse, and oxygen                        saturations were monitored continuously. The                        Colonoscope was introduced through the anus and                        advanced to the the cecum, identified by appendiceal                        orifice and ileocecal valve. The colonoscopy was                        performed without difficulty. The patient tolerated the                        procedure well. The quality of the bowel preparation                        was good. Findings:      A few small-mouthed diverticula were found in the sigmoid colon.      A 3 mm polyp was found in the cecum. The polyp was sessile. The polyp       was removed with a cold biopsy forceps. Resection and retrieval were       complete.      A small anal fissure was found in the anal canal.      The retroflexed view of the distal rectum and anal verge was normal and       showed no anal or rectal abnormalities.      The exam was otherwise without abnormality. Impression:           -  Diverticulosis in the sigmoid colon.                   - One 3 mm polyp in the cecum, removed with a cold                        biopsy forceps. Resected and retrieved.                       - Anal fissure.                       - The distal rectum and anal verge are normal on                        retroflexion view.                       - The examination was otherwise normal. Recommendation:       - Discharge patient to home.                       - Soft diet today, then advance as tolerated to advance                        diet as tolerated.                       - Use Analpram HC Cream 2.5%: Apply externally TID for                        1 week. Procedure Code(s):    --- Professional ---                       [phone], Colonoscopy, flexible; with biopsy, single or                        multiple Diagnosis Code(s):    --- Professional ---                       Z12.11, Encounter for screening for malignant neoplasm                        of colon                       K63.5, Polyp of colon                       K60.2, Anal fissure, unspecified                       K57.30, Diverticulosis of large intestine without                        perforation or abscess without bleeding CPT copyright 2019 American Medical Association. All rights reserved. The codes documented in this report are preliminary and upon coder review may  be revised to meet current compliance requirements. Lollie Sails, MD 03/05/2019 8:12:49 AM This report has been signed electronically. Number of Addenda: 0 Note Initiated On: 03/05/2019 7:46 AM Scope Withdrawal Time: 0 hours 7 minutes 30 seconds  Total Procedure Duration: 0 hours 13  minutes 56 seconds       [facility]

## 2019-03-05 NOTE — Discharge Instructions (Signed)
Anal Fissure, Adult  An anal fissure is a small tear or crack in the tissue around the opening of the butt (anus). Bleeding from the tear or crack usually stops on its own within a few minutes. The bleeding may happen every time you poop (have a bowel movement) until the tear or crack heals. What are the causes? This condition is usually caused by passing a large or hard poop (stool). Other causes include:  Trouble pooping (constipation).  Passing watery poop (diarrhea).  Inflammatory bowel disease (Crohn's disease or ulcerative colitis).  Childbirth.  Infections.  Anal sex. What are the signs or symptoms? Symptoms of this condition include:  Bleeding from the butt.  Small amounts of blood on your poop. The blood coats the outside of the poop. It is not mixed with the poop.  Small amounts of blood on the toilet paper or in the toilet after you poop.  Pain when passing poop.  Itching or irritation around the opening of the butt. How is this diagnosed? This condition may be diagnosed based on a physical exam. Your doctor may:  Check your butt. A tear can often be seen by checking the area with care.  Check your butt using a short tube (anoscope). The light in the tube will show any problems in your butt. How is this treated? Treatment for this condition may include:  Treating problems that make it hard for you to pass poop. You may be told to: ? Eat more fiber. ? Drink more fluid. ? Take fiber supplements. ? Take medicines that make poop soft.  Taking sitz baths. This may help to heal the tear.  Using creams and ointments. If your condition gets worse, other treatments may be needed such as:  A shot near the tear or crack (botulinum injection).  Surgery to repair the tear or crack. Follow these instructions at home: Eating and drinking   Avoid bananas and dairy products. These foods can make it hard to poop.  Drink enough fluid to keep your pee (urine) pale  yellow.  Eat foods that have a lot of fiber in them, such as: ? Beans. ? Whole grains. ? Fresh fruits. ? Fresh vegetables. General instructions   Take over-the-counter and prescription medicines only as told by your doctor.  Use creams or ointments only as told by your doctor.  Keep the butt area as clean and dry as you can.  Take a warm water bath (sitz bath) as told by your doctor. Do not use soap.  Keep all follow-up visits as told by your doctor. This is important. Contact a doctor if:  You have more bleeding.  You have a fever.  You have watery poop that is mixed with blood.  You have pain.  Your problem gets worse, not better. Summary  An anal fissure is a small tear or crack in the skin around the opening of the butt (anus).  This condition is usually caused by passing a large or hard poop (stool).  Treatment includes treating the problems that make it hard for you to pass poop.  Follow your doctor's instructions about caring for your condition at home.  Keep all follow-up visits as told by your doctor. This is important. This information is not intended to replace advice given to you by your health care provider. Make sure you discuss any questions you have with your health care provider. Document Released: 02/21/2011 Document Revised: 12/05/2017 Document Reviewed: 12/05/2017 Elsevier Patient Education  2020 Elsevier Inc.  

## 2019-03-05 NOTE — Anesthesia Post-op Follow-up Note (Signed)
Anesthesia QCDR form completed.        

## 2019-03-06 ENCOUNTER — Encounter: Payer: Self-pay | Admitting: Gastroenterology

## 2019-03-06 LAB — SURGICAL PATHOLOGY

## 2019-10-12 ENCOUNTER — Other Ambulatory Visit: Payer: Self-pay | Admitting: Sports Medicine

## 2019-10-12 DIAGNOSIS — M1711 Unilateral primary osteoarthritis, right knee: Secondary | ICD-10-CM

## 2019-10-12 DIAGNOSIS — G8929 Other chronic pain: Secondary | ICD-10-CM

## 2019-10-12 DIAGNOSIS — M25561 Pain in right knee: Secondary | ICD-10-CM

## 2019-10-24 ENCOUNTER — Other Ambulatory Visit: Payer: Self-pay

## 2019-10-24 ENCOUNTER — Ambulatory Visit
Admission: RE | Admit: 2019-10-24 | Discharge: 2019-10-24 | Disposition: A | Discharge status: Home / Self Care | Payer: Medicare HMO | Source: Ambulatory Visit | Attending: Sports Medicine | Admitting: Sports Medicine

## 2019-10-24 DIAGNOSIS — M25561 Pain in right knee: Secondary | ICD-10-CM | POA: Diagnosis not present

## 2019-10-24 DIAGNOSIS — G8929 Other chronic pain: Secondary | ICD-10-CM | POA: Diagnosis present

## 2019-10-24 DIAGNOSIS — M1711 Unilateral primary osteoarthritis, right knee: Secondary | ICD-10-CM | POA: Diagnosis present

## 2021-09-08 ENCOUNTER — Other Ambulatory Visit: Payer: Self-pay | Admitting: Unknown Physician Specialty

## 2021-09-08 DIAGNOSIS — T17908A Unspecified foreign body in respiratory tract, part unspecified causing other injury, initial encounter: Secondary | ICD-10-CM

## 2021-09-20 ENCOUNTER — Ambulatory Visit: Payer: Medicare HMO

## 2021-09-28 ENCOUNTER — Other Ambulatory Visit: Payer: Self-pay

## 2021-09-28 ENCOUNTER — Ambulatory Visit
Admission: RE | Admit: 2021-09-28 | Discharge: 2021-09-28 | Disposition: A | Discharge status: Home / Self Care | Payer: Medicare HMO | Source: Ambulatory Visit | Attending: Unknown Physician Specialty | Admitting: Unknown Physician Specialty

## 2021-09-28 DIAGNOSIS — T17908A Unspecified foreign body in respiratory tract, part unspecified causing other injury, initial encounter: Secondary | ICD-10-CM | POA: Diagnosis present

## 2021-09-28 DIAGNOSIS — R1312 Dysphagia, oropharyngeal phase: Secondary | ICD-10-CM | POA: Insufficient documentation

## 2021-09-28 NOTE — Therapy (Signed)
[place] ?[facility] ?[address] ?[place], [place], [address] ?Phone: [phone]   Fax:    ? ?Modified Barium Swallow ? ?Patient Details  ?Name: Carol Kirby ?MRN: [id number] ?Date of Birth: 1951/04/11 ?No data recorded ? ?Encounter Date: 09/28/2021 ? ? End of Session - 09/28/21 1411   ? ? Visit Number 1   ? Number of Visits 1   ? Date for SLP Re-Evaluation 09/28/21   ? SLP Start Time 1245   ? SLP Stop Time  1315   ? SLP Time Calculation (min) 30 min   ? Activity Tolerance Patient tolerated treatment well   ? ?  ?  ? ?  ? ? ?Past Medical History:  ?Diagnosis Date  ? Anxiety   ? Asthma   ? Breast CA ([place]) 2016  ? right  ? Complication of anesthesia   ? Depression   ? History of kidney stones   ? Hypothyroidism   ? PONV (postoperative nausea and vomiting)   ? ? ?Past Surgical History:  ?Procedure Laterality Date  ? BREAST BIOPSY Left 2001  ? neg  ? CESAREAN SECTION    ? COLONOSCOPY    ? COLONOSCOPY WITH PROPOFOL N/A 03/05/2019  ? Procedure: COLONOSCOPY WITH PROPOFOL;  Surgeon: Lollie Sails, MD;  Location: [facility] ENDOSCOPY;  Service: Endoscopy;  Laterality: N/A;  ? MASTECTOMY PARTIAL / LUMPECTOMY Right 06/24/2015  ? [facility]  ? NASAL SEPTOPLASTY W/ TURBINOPLASTY N/A 03/14/2018  ? Procedure: NASAL SEPTOPLASTY WITH TURBINATE RESECTION;  Surgeon: Beverly Gust, MD;  Location: [place];  Service: ENT;  Laterality: N/A;  ? ? ?There were no vitals filed for this visit. ? ? Subjective Assessment - 09/28/21 1344   ? ? Subjective When I drink it won't go down as fast   ? Currently in Pain? No/denies   ? ?  ?  ? ?  ? ? ? ? ? ? 09/28/21 1300  ?SLP Visit Information  ?SLP Received On 09/28/21  ?Subjective  ?Subjective reports coughing a few times a week with liquids, coughing at night after lying down  ?Pain Assessment  ?Pain Assessment No/denies pain  ?General Information  ?Date of Onset 09/08/21 ?(referral date; pt reports onset ~6 months ago)  ?HPI Patient is a 71  year old female with past medical history including anxiety, depression, breast cancer s/p chemotherapy, hypothyroidism, nasal obstruction s/p nasal septoplasty and submucous resection of inferior turbinates (2019) and allergic rhinitis referred by Dr. Tami Ribas for MBS due to reports of choking with liquids. Laryngoscopy on 09/04/21 showed "tongue base and larynx clear, normal VC mobility."  ?Type of Study MBS-Modified Barium Swallow Study  ?Previous Swallow Assessment none on file  ?Diet Prior to this Study Regular;Thin liquids  ?Temperature Spikes Noted No  ?Respiratory Status Room air  ?History of Recent Intubation No  ?Behavior/Cognition Alert;Cooperative;Pleasant mood  ?Oral Cavity Assessment WFL  ?Oral Care Completed by SLP No  ?Oral Cavity - Dentition Adequate natural dentition  ?Vision Functional for self feeding  ?Self-Feeding Abilities Able to feed self  ?Patient Positioning Upright in chair;Other (comment) ?(also assessed standing in AP view)  ?Baseline Vocal Quality Normal  ?Volitional Cough Strong  ?Volitional Swallow Able to elicit  ?Anatomy WFL  ?Pharyngeal Secretions Not observed secondary MBS  ?Oral Motor/Sensory Function  ?Overall Oral Motor/Sensory Function WFL  ?Oral Preparation/Oral Phase  ?Oral Phase WFL  ?Pharyngeal Phase  ?Pharyngeal Phase WFL  ?Pharyngeal - Pudding  ?Pharyngeal- Pudding Teaspoon WFL  ?Pharyngeal Material does  not enter airway  ?Pharyngeal - Nectar  ?Pharyngeal- Nectar Teaspoon WFL  ?Pharyngeal Material does not enter airway  ?Pharyngeal- Nectar Cup [facility] - Eat  ?Pharyngeal Material does not enter airway  ?Pharyngeal - Thin  ?Pharyngeal- Thin Teaspoon WFL  ?Pharyngeal Material does not enter airway  ?Pharyngeal- Thin Cup [facility];Pharyngeal residue - valleculae ?(Mild retention bilaterally in what appears to be small pouch along posterior oropharynx consistent with appearance of pharyngoceles. Spills to valleculae and is cleared with subsequent swallow.)  ?Pharyngeal Material does not enter  airway  ?Pharyngeal - Solids  ?Pharyngeal- Puree WFL  ?Pharyngeal- Regular WFL  ?Cervical Esophageal Phase  ?Cervical Esophageal Phase Impaired  ?Cervical Esophageal Phase - Nectar  ?Nectar Cup Esophageal backflow into cervical esophagus  ?Cervical Esophageal Phase - Thin  ?Thin Cup Esophageal backflow into cervical esophagus  ?Cervical Esophageal Phase - Comment  ?Other Esophageal Phase Observations esophageal sweep prior to AP imaging revealed standing barium in the lower half of the esophagus  ?Clinical Impression  ?Clinical Impression Patient presents with functional oropharyngeal swallow and suspected primary esophageal dysphagia. Oral stage is characterized by adequate lip closure, bolus preparation, and anterior to posterior transit. Swallow initiation is timely at the level of the base of tongue. Pharyngeal stage is noted for adequate tongue base retraction, hyolaryngeal excursion, and pharyngeal constriction. Epiglottic deflection is complete; there is no penetration or aspiration. Small bilateral pouching of posterior oropharynx consistent with appearance of pharyngoceles collects and retains mild amount of contrast with thin liquids, which spills to the valleculae post-swallow. Pt clears this with subsequent swallow. Heavier/thicker consistencies did not collect in this area. Pharyngeal stripping wave is complete. Amplitude/duration of pharyngoesophageal segment opening is WFL. With thin and nectar liquids, observed retrograde bolus flow into the cervical esophagus. An esophageal sweep in the upright, anterior-posterior view was performed with nectar thick liquids and pt was noted to have standing barium in the lower 1/2 of the esophagus. SLP educated on reflux modifications and provided handout with this information. Patient would benefit from GI referral/ further assessment of esophageal function. Recommend patient continue regular diet with thin liquids; may benefit from intermittent double swallow  after thin liquids to reduce residue collection. No further ST indicated at this time.  ?SLP Visit Diagnosis Dysphagia, pharyngoesophageal phase (R13.14)  ?Impact on safety and function Mild aspiration risk  ?Swallow Evaluation Recommendations  ?Recommended Consults Consider GI evaluation;Consider esophageal assessment  ?SLP Diet Recommendations Regular solids;Thin liquid  ?Liquid Administration via Cup  ?Medication Administration Whole meds with liquid  ?Supervision Patient able to self feed  ?Compensations Slow rate;Small sips/bites  ?Postural Changes Remain semi-upright after after feeds/meals (Comment);Seated upright at 90 degrees  ?Treatment Plan  ?Oral Care Recommendations Oral care BID  ?Treatment Recommendations No treatment recommended at this time  ?Follow Up Recommendations No SLP follow up  ?Individuals Consulted  ?Consulted and Agree with Results and Recommendations Patient  ?Report Sent to  Referring physician  ?Progression Toward Goals  ?Progression toward goals Progressing toward goals  ?SLP Time Calculation  ?SLP Start Time (ACUTE ONLY) 1245  ?SLP Stop Time (ACUTE ONLY) 1315  ?SLP Time Calculation (min) (ACUTE ONLY) 30 min  ?SLP Evaluations  ?$ SLP Speech Visit 1 Visit  ?SLP Evaluations  ?$Outpatient MBS Swallow 1 Procedure  ? ?  ?Dysphagia, oropharyngeal phase ? ?Aspiration into airway, initial encounter - Plan: DG SWALLOW FUNC OP MEDICARE SPEECH PATH, DG SWALLOW FUNC OP MEDICARE SPEECH PATH ? ? ? ? ? ? ? ?Problem List ?There are no problems to display  for this patient. ? ?Deneise Lever, MS, CCC-SLP ?Speech-Language Pathologist ?[phone] ? ?Aliene Altes, CCC-SLP ?09/28/2021, 2:12 PM ? ?[place] ?[facility] ?[address] ?[place], [place], [address] ?Phone: [phone]   Fax:    ? ?Name: Carol Kirby ?MRN: [id number] ?Date of Birth: [birth year] ? ?

## 2022-02-07 ENCOUNTER — Ambulatory Visit (INDEPENDENT_AMBULATORY_CARE_PROVIDER_SITE_OTHER): Payer: Medicare HMO | Admitting: Gastroenterology

## 2022-02-07 ENCOUNTER — Encounter: Payer: Self-pay | Admitting: Gastroenterology

## 2022-02-07 VITALS — BP 143/89 | HR 82 | Temp 98.1°F | Ht 63.0 in | Wt 144.0 lb

## 2022-02-07 DIAGNOSIS — R1310 Dysphagia, unspecified: Secondary | ICD-10-CM | POA: Diagnosis not present

## 2022-02-07 DIAGNOSIS — R1013 Epigastric pain: Secondary | ICD-10-CM | POA: Diagnosis not present

## 2022-02-07 NOTE — Progress Notes (Signed)
Gastroenterology Consultation  Referring Provider:     Lenard Simmer, MD Primary Care Physician:  Lenard Simmer, MD Primary Gastroenterologist:  Dr. Allen Norris     Reason for Consultation:     Dysphagia        HPI:   Carol Kirby is a 71 y.o. y/o female referred for consultation & management of dysphagia by Dr. Ronnald Collum, Lourdes Sledge, MD. This patient was seen by ENT and was set up for a modified barium swallow.  The modified barium swallow by the speech pathologist was reported as:  "With thin and nectar liquids, observed retrograde bolus flow into the cervical esophagus. An esophageal sweep in the upright, anterior-posterior view was performed with nectar thick liquids and pt was noted to have standing barium in the lower 1/2 of the esophagus. SLP educated on reflux modifications and provided handout with this information. Patient would benefit from GI referral/ further assessment of esophageal function. Recommend patient continue regular diet with thin liquids; may benefit from intermittent double swallow after thin liquids to reduce residue collection. No further ST indicated at this time."  The patient reports that she has abdominal pain in the mid abdomen that can be worse with movement and sometimes improved with leg lifts.  She also states that her husband will massage her back which will make the pain go away.  Past Medical History:  Diagnosis Date   Anxiety    Asthma    Breast CA ([place]) 8341   right   Complication of anesthesia    Depression    History of kidney stones    Hypothyroidism    PONV (postoperative nausea and vomiting)     Past Surgical History:  Procedure Laterality Date   BREAST BIOPSY Left 2001   neg   CESAREAN SECTION     COLONOSCOPY     COLONOSCOPY WITH PROPOFOL N/A 03/05/2019   Procedure: COLONOSCOPY WITH PROPOFOL;  Surgeon: Lollie Sails, MD;  [facility] ENDOSCOPY;  Service: Endoscopy;  Laterality: N/A;   MASTECTOMY PARTIAL / LUMPECTOMY  Right 06/24/2015   [facility]   NASAL SEPTOPLASTY W/ TURBINOPLASTY N/A 03/14/2018   Procedure: NASAL SEPTOPLASTY WITH TURBINATE RESECTION;  Surgeon: Beverly Gust, MD;  Location: [place];  Service: ENT;  Laterality: N/A;    Prior to Admission medications   Medication Sig Start Date End Date Taking? Authorizing Provider  clindamycin (CLEOCIN) 300 MG capsule Take 1 capsule (300 mg total) by mouth 3 (three) times daily. Patient not taking: Reported on 03/05/2019 03/14/18   Beverly Gust, MD  cyanocobalamin (,VITAMIN B-12,) 1000 MCG/ML injection Inject 1,000 mcg into the muscle every 30 (thirty) days.    [provider]  folic acid (FOLVITE) 1 MG tablet Take 1 mg by mouth daily.    [provider]  levothyroxine (SYNTHROID, LEVOTHROID) 100 MCG tablet Take 100 mcg by mouth daily before breakfast.    [provider]  montelukast (SINGULAIR) 10 MG tablet Take 10 mg by mouth daily.    [provider]  ranitidine (ZANTAC) 150 MG tablet Take 150 mg by mouth daily as needed for heartburn.    [provider]  simvastatin (ZOCOR) 20 MG tablet Take 20 mg by mouth daily.    [provider]  Vitamin D, Ergocalciferol, (DRISDOL) 50000 units CAPS capsule Take 50,000 Units by mouth every 30 (thirty) days.    [provider]    No family history on file.   Social History   Tobacco Use  Smoking status: Never   Smokeless tobacco: Never  Vaping Use   Vaping Use: Never used  Substance Use Topics   Alcohol use: Yes    Alcohol/week: 0.0 - 1.0 standard drinks of alcohol   Drug use: Never    Allergies as of 02/07/2022 - Review Complete 09/28/2021  Allergen Reaction Noted   Sulfa antibiotics Rash 03/12/2018    Review of Systems:    All systems reviewed and negative except where noted in HPI.   Physical Exam:  There were no vitals taken for this visit. No LMP recorded. Patient is postmenopausal. General:   Alert,  Well-developed,  well-nourished, pleasant and cooperative in NAD Head:  Normocephalic and atraumatic. Eyes:  Sclera clear, no icterus.   Conjunctiva pink. Ears:  Normal auditory acuity. Neck:  Supple; no masses or thyromegaly. Lungs:  Respirations even and unlabored.  Clear throughout to auscultation.   No wheezes, crackles, or rhonchi. No acute distress. Heart:  Regular rate and rhythm; no murmurs, clicks, rubs, or gallops. Abdomen:  Normal bowel sounds.  No bruits.  Soft, positive tenderness in the epigastric region with raising the patient's legs above the exam table while palpating with 1 finger.  And non-distended without masses, hepatosplenomegaly or hernias noted.  No guarding or rebound tenderness.  Positive Carnett sign.   Rectal:  Deferred.  Pulses:  Normal pulses noted. Extremities:  No clubbing or edema.  No cyanosis. Neurologic:  Alert and oriented x3;  grossly normal neurologically. Skin:  Intact without significant lesions or rashes.  No jaundice. Lymph Nodes:  No significant cervical adenopathy. Psych:  Alert and cooperative. Normal mood and affect.  Imaging Studies: No results found.  Assessment and Plan:   Carol Kirby is a 71 y.o. y/o female who comes in today with a history of dysphagia and she reports that she feels like she is having postnasal drip with mucus in her throat.  The patient was recommended to see me by speech pathology.  The patient had a barium sitting in the lower part of her esophagus and will be set up for an upper endoscopy to rule out any esophageal pathology.  The patient has been explained the abdominal pain is musculoskeletal.  The patient has been explained the plan and agrees with it and will follow-up at the time of the EGD.    [name], MD. [facility]al    Note: This dictation was prepared with Dragon dictation along with smaller phrase technology. Any transcriptional errors that result from this process are unintentional.

## 2022-02-07 NOTE — Addendum Note (Signed)
Addended by: Lurlean Nanny on: 02/07/2022 09:26 AM   Modules accepted: Orders

## 2022-03-07 ENCOUNTER — Inpatient Hospital Stay
Admission: RE | Admit: 2022-03-07 | Discharge: 2022-03-07 | Disposition: A | Discharge status: Home / Self Care | Payer: Self-pay | Source: Ambulatory Visit | Attending: *Deleted | Admitting: *Deleted

## 2022-03-07 ENCOUNTER — Other Ambulatory Visit: Payer: Self-pay | Admitting: Endocrinology

## 2022-03-07 ENCOUNTER — Other Ambulatory Visit: Payer: Self-pay | Admitting: *Deleted

## 2022-03-07 ENCOUNTER — Encounter: Payer: Self-pay | Admitting: Emergency Medicine

## 2022-03-07 ENCOUNTER — Other Ambulatory Visit: Payer: Self-pay | Admitting: Obstetrics and Gynecology

## 2022-03-07 ENCOUNTER — Emergency Department
Admission: EM | Admit: 2022-03-07 | Discharge: 2022-03-07 | Discharge status: Against Medical Advice | Payer: Medicare HMO | Attending: Emergency Medicine | Admitting: Emergency Medicine

## 2022-03-07 DIAGNOSIS — Z1231 Encounter for screening mammogram for malignant neoplasm of breast: Secondary | ICD-10-CM

## 2022-03-07 DIAGNOSIS — R079 Chest pain, unspecified: Secondary | ICD-10-CM | POA: Diagnosis not present

## 2022-03-07 DIAGNOSIS — Z5321 Procedure and treatment not carried out due to patient leaving prior to being seen by health care provider: Secondary | ICD-10-CM | POA: Diagnosis not present

## 2022-03-07 DIAGNOSIS — R1013 Epigastric pain: Secondary | ICD-10-CM | POA: Diagnosis present

## 2022-03-07 LAB — COMPREHENSIVE METABOLIC PANEL
ALT: 16 U/L (ref 0–44)
AST: 27 U/L (ref 15–41)
Albumin: 4.1 g/dL (ref 3.5–5.0)
Alkaline Phosphatase: 72 U/L (ref 38–126)
Anion gap: 8 (ref 5–15)
BUN: 14 mg/dL (ref 8–23)
CO2: 26 mmol/L (ref 22–32)
Calcium: 9.2 mg/dL (ref 8.9–10.3)
Chloride: 108 mmol/L (ref 98–111)
Creatinine, Ser: 0.71 mg/dL (ref 0.44–1.00)
GFR, Estimated: >60 mL/min (ref >60)
Glucose, Bld: 110 mg/dL — ABNORMAL HIGH (ref 70–99)
Potassium: 3.9 mmol/L (ref 3.5–5.1)
Sodium: 142 mmol/L (ref 135–145)
Total Bilirubin: 0.5 mg/dL (ref 0.3–1.2)
Total Protein: 6.6 g/dL (ref 6.5–8.1)

## 2022-03-07 LAB — URINALYSIS, ROUTINE W REFLEX MICROSCOPIC
Bilirubin Urine: NEGATIVE
Glucose, UA: NEGATIVE mg/dL
Hgb urine dipstick: NEGATIVE
Ketones, ur: NEGATIVE mg/dL
Leukocytes,Ua: NEGATIVE
Nitrite: NEGATIVE
Protein, ur: NEGATIVE mg/dL
Specific Gravity, Urine: 1.016 (ref 1.005–1.030)
pH: 6 (ref 5.0–8.0)

## 2022-03-07 LAB — CBC
HCT: 38.4 % (ref 36.0–46.0)
Hemoglobin: 12.8 g/dL (ref 12.0–15.0)
MCH: 30.7 pg (ref 26.0–34.0)
MCHC: 33.3 g/dL (ref 30.0–36.0)
MCV: 92.1 fL (ref 80.0–100.0)
Platelets: 256 10*3/uL (ref 150–400)
RBC: 4.17 MIL/uL (ref 3.87–5.11)
RDW: 13.3 % (ref 11.5–15.5)
WBC: 8 10*3/uL (ref 4.0–10.5)
nRBC: 0 % (ref 0.0–0.2)

## 2022-03-07 LAB — LIPASE, BLOOD: Lipase: 44 U/L (ref 11–51)

## 2022-03-07 LAB — TROPONIN I (HIGH SENSITIVITY): Troponin I (High Sensitivity): 5 ng/L (ref <18)

## 2022-03-07 NOTE — ED Triage Notes (Signed)
EMS brings pt in from home for c/o sudden onset abd/chest pain after eating

## 2022-03-07 NOTE — ED Triage Notes (Signed)
Pt arrived via [facility] from home with c/o sudden onset of upper epigastric pain as well as bilateral flank areas, starting at 1900, 1 hour post eating dinner. Pt arrived to triage with all symptoms resolved and no current complaints. Pt sts she has had same in past as well as trouble swallowing and is having endoscopy procedure/testing in October.

## 2022-03-16 LAB — EXTERNAL GENERIC LAB PROCEDURE: COLOGUARD: NEGATIVE

## 2022-03-16 LAB — COLOGUARD: COLOGUARD: NEGATIVE

## 2022-04-10 ENCOUNTER — Ambulatory Visit
Admission: RE | Admit: 2022-04-10 | Discharge: 2022-04-10 | Disposition: A | Discharge status: Home / Self Care | Payer: Medicare HMO | Source: Ambulatory Visit | Attending: Obstetrics and Gynecology | Admitting: Obstetrics and Gynecology

## 2022-04-10 DIAGNOSIS — Z1231 Encounter for screening mammogram for malignant neoplasm of breast: Secondary | ICD-10-CM | POA: Diagnosis present

## 2022-04-23 ENCOUNTER — Encounter: Payer: Self-pay | Admitting: Gastroenterology

## 2022-04-24 ENCOUNTER — Encounter
Admission: RE | Disposition: A | Discharge status: Home / Self Care | Payer: Self-pay | Source: Home / Self Care | Attending: Gastroenterology

## 2022-04-24 ENCOUNTER — Ambulatory Visit
Admission: RE | Admit: 2022-04-24 | Discharge: 2022-04-24 | Disposition: A | Discharge status: Home / Self Care | Payer: Medicare HMO | Attending: Gastroenterology | Admitting: Gastroenterology

## 2022-04-24 ENCOUNTER — Ambulatory Visit: Payer: Medicare HMO | Admitting: Anesthesiology

## 2022-04-24 DIAGNOSIS — R1310 Dysphagia, unspecified: Secondary | ICD-10-CM | POA: Diagnosis not present

## 2022-04-24 DIAGNOSIS — K222 Esophageal obstruction: Secondary | ICD-10-CM | POA: Diagnosis present

## 2022-04-24 DIAGNOSIS — F418 Other specified anxiety disorders: Secondary | ICD-10-CM | POA: Diagnosis not present

## 2022-04-24 DIAGNOSIS — J45909 Unspecified asthma, uncomplicated: Secondary | ICD-10-CM | POA: Diagnosis not present

## 2022-04-24 DIAGNOSIS — E039 Hypothyroidism, unspecified: Secondary | ICD-10-CM | POA: Diagnosis not present

## 2022-04-24 DIAGNOSIS — Z853 Personal history of malignant neoplasm of breast: Secondary | ICD-10-CM | POA: Diagnosis not present

## 2022-04-24 DIAGNOSIS — K449 Diaphragmatic hernia without obstruction or gangrene: Secondary | ICD-10-CM | POA: Insufficient documentation

## 2022-04-24 HISTORY — PX: ESOPHAGOGASTRODUODENOSCOPY (EGD) WITH PROPOFOL: SHX5813

## 2022-04-24 SURGERY — ESOPHAGOGASTRODUODENOSCOPY (EGD) WITH PROPOFOL
Anesthesia: General

## 2022-04-24 MED ORDER — PROPOFOL 1000 MG/100ML IV EMUL
INTRAVENOUS | Status: AC
  Filled 2022-04-24: qty 100

## 2022-04-24 MED ORDER — EPHEDRINE 5 MG/ML INJ
INTRAVENOUS | Status: AC
  Filled 2022-04-24: qty 5

## 2022-04-24 MED ORDER — PROPOFOL 10 MG/ML IV BOLUS
INTRAVENOUS | Status: DC | PRN
  Administered 2022-04-24: 60 mg via INTRAVENOUS

## 2022-04-24 MED ORDER — PROPOFOL 500 MG/50ML IV EMUL
INTRAVENOUS | Status: DC | PRN
  Administered 2022-04-24: 175 ug/kg/min via INTRAVENOUS

## 2022-04-24 MED ORDER — SODIUM CHLORIDE 0.9 % IV SOLN: INTRAVENOUS | Status: DC

## 2022-04-24 MED ORDER — LIDOCAINE HCL (CARDIAC) PF 100 MG/5ML IV SOSY
PREFILLED_SYRINGE | INTRAVENOUS | Status: DC | PRN
  Administered 2022-04-24: 100 mg via INTRAVENOUS

## 2022-04-24 NOTE — Anesthesia Postprocedure Evaluation (Signed)
Anesthesia Post Note  Patient: Carol Kirby  Procedure(s) Performed: ESOPHAGOGASTRODUODENOSCOPY (EGD) WITH PROPOFOL  Patient location during evaluation: PACU Anesthesia Type: General Level of consciousness: awake and alert, oriented and patient cooperative Pain management: pain level controlled Vital Signs Assessment: post-procedure vital signs reviewed and stable Respiratory status: spontaneous breathing, nonlabored ventilation and respiratory function stable Cardiovascular status: blood pressure returned to baseline and stable Postop Assessment: adequate PO intake Anesthetic complications: no   No notable events documented.   Last Vitals:  Vitals:   04/24/22 1015 04/24/22 1025  BP: (!) 150/86 (!) 148/96  Pulse: 69 62  Resp: 16 17  Temp:    SpO2: 98% 100%    Last Pain:  Vitals:   04/24/22 1025  TempSrc:   PainSc: 0-No pain                 Darrin Nipper

## 2022-04-24 NOTE — Anesthesia Preprocedure Evaluation (Addendum)
Anesthesia Evaluation  Patient identified by MRN, date of birth, ID band Patient awake    Reviewed: Allergy & Precautions, NPO status , Patient's Chart, lab work & pertinent test results  History of Anesthesia Complications (+) PONV and history of anesthetic complications  Airway Mallampati: II   Neck ROM: Full    Dental no notable dental hx.    Pulmonary asthma ,    Pulmonary exam normal breath sounds clear to auscultation       Cardiovascular Exercise Tolerance: Good Normal cardiovascular exam Rhythm:Regular Rate:Normal  ECG 03/07/22:  Normal sinus rhythm Incomplete right bundle branch block   Neuro/Psych PSYCHIATRIC DISORDERS Anxiety Depression Chronic pain    GI/Hepatic negative GI ROS,   Endo/Other  Hypothyroidism   Renal/GU Renal disease (nephrolithiasis)     Musculoskeletal   Abdominal   Peds  Hematology Breast CA   Anesthesia Other Findings   Reproductive/Obstetrics                            Anesthesia Physical Anesthesia Plan  ASA: 2  Anesthesia Plan: General   Post-op Pain Management:    Induction: Intravenous  PONV Risk Score and Plan: 4 or greater and Propofol infusion, TIVA and Treatment may vary due to age or medical condition  Airway Management Planned: Natural Airway  Additional Equipment:   Intra-op Plan:   Post-operative Plan:   Informed Consent: I have reviewed the patients History and Physical, chart, labs and discussed the procedure including the risks, benefits and alternatives for the proposed anesthesia with the patient or authorized representative who has indicated his/her understanding and acceptance.       Plan Discussed with: CRNA  Anesthesia Plan Comments: (LMA/GETA backup discussed.  Patient consented for risks of anesthesia including but not limited to:  - adverse reactions to medications - damage to eyes, teeth, lips or other oral  mucosa - nerve damage due to positioning  - sore throat or hoarseness - damage to heart, brain, nerves, lungs, other parts of body or loss of life  Informed patient about role of CRNA in peri- and intra-operative care.  Patient voiced understanding.)        Anesthesia Quick Evaluation

## 2022-04-24 NOTE — Op Note (Signed)
[facility] Gastroenterology Patient Name: Carol Kirby Procedure Date: 04/24/2022 9:50 AM MRN: [id number] Account #: [id number] Date of Birth: Sep 25, 1950 Admit Type: Outpatient Age: 71 Room: [facility] ENDO ROOM 4 Gender: Female Note Status: Finalized Instrument Name: Upper Endoscope [id number] Procedure:             Upper GI endoscopy Indications:           Dysphagia Providers:             [name] MD, MD Referring MD:          [name] MD, MD (Referring MD), Lenard Simmer,                         MD (Referring MD) Medicines:             Propofol per Anesthesia Complications:         No immediate complications. Procedure:             Pre-Anesthesia Assessment:                        - Prior to the procedure, a History and Physical was                         performed, and patient medications and allergies were                         reviewed. The patient's tolerance of previous                         anesthesia was also reviewed. The risks and benefits                         of the procedure and the sedation options and risks                         were discussed with the patient. All questions were                         answered, and informed consent was obtained. Prior                         Anticoagulants: The patient has taken no previous                         anticoagulant or antiplatelet agents. ASA Grade                         Assessment: II - A patient with mild systemic disease.                         After reviewing the risks and benefits, the patient                         was deemed in satisfactory condition to undergo the                         procedure.  After obtaining informed consent, the endoscope was                         passed under direct vision. Throughout the procedure,                         the patient's blood pressure, pulse, and oxygen                         saturations were monitored  continuously. The Endoscope                         was introduced through the mouth, and advanced to the                         second part of duodenum. The upper GI endoscopy was                         accomplished without difficulty. The patient tolerated                         the procedure well. Findings:      A medium-sized hiatal hernia was present.      One benign-appearing, intrinsic moderate stenosis was found at the       gastroesophageal junction. This stenosis measured 1.2 cm (inner       diameter). The stenosis was traversed. A TTS dilator was passed through       the scope. Dilation with a 12-13.5-15 mm balloon dilator was performed       to 15 mm. The dilation site was examined following endoscope reinsertion       and showed moderate improvement in luminal narrowing.      The stomach was normal.      The examined duodenum was normal. Impression:            - Medium-sized hiatal hernia.                        - Benign-appearing esophageal stenosis. Dilated.                        - Normal stomach.                        - Normal examined duodenum.                        - No specimens collected. Recommendation:        - Discharge patient to home.                        - Resume previous diet.                        - Continue present medications.                        - Repeat upper endoscopy in 4 weeks for retreatment. Procedure Code(s):     --- Professional ---  [phone], Esophagogastroduodenoscopy, flexible,                         transoral; with transendoscopic balloon dilation of                         esophagus (less than 30 mm diameter) Diagnosis Code(s):     --- Professional ---                        R13.10, Dysphagia, unspecified                        K22.2, Esophageal obstruction CPT copyright 2019 American Medical Association. All rights reserved. The codes documented in this report are preliminary and upon coder review may  be  revised to meet current compliance requirements. [name] MD, MD 04/24/2022 10:02:41 AM This report has been signed electronically. Number of Addenda: 0 Note Initiated On: 04/24/2022 9:50 AM Estimated Blood Loss:  Estimated blood loss: none.      [facility]

## 2022-04-24 NOTE — H&P (Signed)
[name], MD [facility] [address] [place], [place] [address] Phone:[phone] Fax : [phone]  Primary Care Physician:  Lenard Simmer, MD Primary Gastroenterologist:  Dr. Allen Norris  Pre-Procedure History & Physical: HPI:  Carol Kirby is a 71 y.o. female is here for an endoscopy.   Past Medical History:  Diagnosis Date   Anxiety    Asthma    Breast CA ([place]) 2016   right   Breast cancer ([place]) 5053   Complication of anesthesia    Depression    History of kidney stones    Hypothyroidism    Personal history of chemotherapy 2016   Right Breast Cancer   Personal history of radiation therapy 2016   Right Breast Cancer   PONV (postoperative nausea and vomiting)     Past Surgical History:  Procedure Laterality Date   BREAST BIOPSY Left 2001   neg   BREAST EXCISIONAL BIOPSY Left    CESAREAN SECTION     COLONOSCOPY     COLONOSCOPY WITH PROPOFOL N/A 03/05/2019   Procedure: COLONOSCOPY WITH PROPOFOL;  Surgeon: Lollie Sails, MD;  Location: [facility] ENDOSCOPY;  Service: Endoscopy;  Laterality: N/A;   MASTECTOMY PARTIAL / LUMPECTOMY Right 06/24/2015   [facility]   NASAL SEPTOPLASTY W/ TURBINOPLASTY N/A 03/14/2018   Procedure: NASAL SEPTOPLASTY WITH TURBINATE RESECTION;  Surgeon: Beverly Gust, MD;  Location: [facility];  Service: ENT;  Laterality: N/A;    Prior to Admission medications   Medication Sig Start Date End Date Taking? Authorizing Provider  levothyroxine (SYNTHROID) 88 MCG tablet Take 88 mcg by mouth daily before breakfast.   Yes [provider]  montelukast (SINGULAIR) 10 MG tablet Take 10 mg by mouth daily.   Yes [provider]  simvastatin (ZOCOR) 20 MG tablet Take 20 mg by mouth daily.   Yes [provider]  Vitamin D, Ergocalciferol, (DRISDOL) 50000 units CAPS capsule Take 50,000 Units by mouth every 30 (thirty) days.   Yes [provider]  cyanocobalamin (,VITAMIN B-12,) 1000 MCG/ML injection Inject  1,000 mcg into the muscle every 30 (thirty) days.    [provider]  folic acid (FOLVITE) 1 MG tablet Take 1 mg by mouth daily. Patient not taking: Reported on 04/24/2022    [provider]  ranitidine (ZANTAC) 150 MG tablet Take 150 mg by mouth daily as needed for heartburn. Patient not taking: Reported on 04/24/2022    [provider]    Allergies as of 02/07/2022 - Review Complete 02/07/2022  Allergen Reaction Noted   Sulfa antibiotics Rash 03/12/2018    History reviewed. No pertinent family history.  Social History   Socioeconomic History   Marital status: Married    Spouse name: Not on file   Number of children: Not on file   Years of education: Not on file   Highest education level: Not on file  Occupational History   Not on file  Tobacco Use   Smoking status: Never   Smokeless tobacco: Never  Vaping Use   Vaping Use: Never used  Substance and Sexual Activity   Alcohol use: Yes    Alcohol/week: 0.0 - 1.0 standard drinks of alcohol   Drug use: Never   Sexual activity: Yes  Other Topics Concern   Not on file  Social History Narrative   Not on file   Social Determinants of Health   Financial Resource Strain: Not on file  Food Insecurity: Not on file  Transportation Needs: Not on file  Physical Activity: Not on file  Stress: Not on file  Social Connections: Not on file  Intimate Partner Violence: Not on file    Review of Systems: See HPI, otherwise negative ROS  Physical Exam: BP 136/72   Pulse 73   Temp (!) 96.8 F (36 C) (Temporal)   Resp 18   Ht '5\' 2"'$  (1.575 m)   Wt 63.5 kg   SpO2 100%   BMI 25.61 kg/m  General:   Alert,  pleasant and cooperative in NAD Head:  Normocephalic and atraumatic. Neck:  Supple; no masses or thyromegaly. Lungs:  Clear throughout to auscultation.    Heart:  Regular rate and rhythm. Abdomen:  Soft, nontender and nondistended. Normal bowel sounds, without guarding, and without rebound.    Neurologic:  Alert and  oriented x4;  grossly normal neurologically.  Impression/Plan: Carol Kirby is here for an endoscopy to be performed for dysphagia  Risks, benefits, limitations, and alternatives regarding  endoscopy have been reviewed with the patient.  Questions have been answered.  All parties agreeable.   [name], MD  04/24/2022, 9:41 AM

## 2022-04-24 NOTE — Transfer of Care (Signed)
Immediate Anesthesia Transfer of Care Note  Patient: Carol Kirby  Procedure(s) Performed: ESOPHAGOGASTRODUODENOSCOPY (EGD) WITH PROPOFOL  Patient Location: Endoscopy Unit  Anesthesia Type:General  Level of Consciousness: drowsy  Airway & Oxygen Therapy: Patient Spontanous Breathing  Post-op Assessment: Report given to RN and Post -op Vital signs reviewed and stable  Post vital signs: Reviewed and stable  Last Vitals:  Vitals Value Taken Time  BP 116/70 04/24/22 1005  Temp    Pulse 73 04/24/22 1006  Resp 22 04/24/22 1006  SpO2 94 % 04/24/22 1006  Vitals shown include unvalidated device data.  Last Pain:  Vitals:   04/24/22 0935  TempSrc: Temporal  PainSc: 0-No pain         Complications: No notable events documented.

## 2022-04-25 ENCOUNTER — Encounter: Payer: Self-pay | Admitting: Gastroenterology

## 2022-04-30 ENCOUNTER — Telehealth: Payer: Self-pay | Admitting: *Deleted

## 2022-04-30 DIAGNOSIS — R1310 Dysphagia, unspecified: Secondary | ICD-10-CM

## 2022-04-30 DIAGNOSIS — K222 Esophageal obstruction: Secondary | ICD-10-CM

## 2022-04-30 NOTE — Telephone Encounter (Signed)
Patient called office that she was told after her EGD that was completed on 04/24/2022, she was supposed to get a repeat EGD in 4 weeks.  Okay to go ahead and schedule?

## 2022-05-01 ENCOUNTER — Telehealth: Payer: Self-pay | Admitting: *Deleted

## 2022-05-01 ENCOUNTER — Other Ambulatory Visit: Payer: Self-pay | Admitting: *Deleted

## 2022-05-01 DIAGNOSIS — K222 Esophageal obstruction: Secondary | ICD-10-CM

## 2022-05-01 DIAGNOSIS — R1310 Dysphagia, unspecified: Secondary | ICD-10-CM

## 2022-05-01 NOTE — Telephone Encounter (Signed)
Spoken to patient and EGD have been schedule for 05/25/2022

## 2022-05-01 NOTE — Telephone Encounter (Signed)
Voicemail message left for patient to return my call.  

## 2022-05-01 NOTE — Telephone Encounter (Deleted)
Error

## 2022-05-18 ENCOUNTER — Encounter: Payer: Self-pay | Admitting: Gastroenterology

## 2022-05-25 ENCOUNTER — Encounter: Payer: Self-pay | Admitting: Gastroenterology

## 2022-05-25 ENCOUNTER — Ambulatory Visit: Payer: Medicare HMO | Admitting: General Practice

## 2022-05-25 ENCOUNTER — Ambulatory Visit
Admission: RE | Admit: 2022-05-25 | Discharge: 2022-05-25 | Disposition: A | Discharge status: Home / Self Care | Payer: Medicare HMO | Attending: Gastroenterology | Admitting: Gastroenterology

## 2022-05-25 ENCOUNTER — Other Ambulatory Visit: Payer: Self-pay

## 2022-05-25 ENCOUNTER — Encounter
Admission: RE | Disposition: A | Discharge status: Home / Self Care | Payer: Self-pay | Source: Home / Self Care | Attending: Gastroenterology

## 2022-05-25 DIAGNOSIS — I4510 Unspecified right bundle-branch block: Secondary | ICD-10-CM | POA: Insufficient documentation

## 2022-05-25 DIAGNOSIS — K222 Esophageal obstruction: Secondary | ICD-10-CM | POA: Insufficient documentation

## 2022-05-25 DIAGNOSIS — F419 Anxiety disorder, unspecified: Secondary | ICD-10-CM | POA: Insufficient documentation

## 2022-05-25 DIAGNOSIS — F32A Depression, unspecified: Secondary | ICD-10-CM | POA: Insufficient documentation

## 2022-05-25 DIAGNOSIS — J45909 Unspecified asthma, uncomplicated: Secondary | ICD-10-CM | POA: Diagnosis not present

## 2022-05-25 DIAGNOSIS — G8929 Other chronic pain: Secondary | ICD-10-CM | POA: Insufficient documentation

## 2022-05-25 DIAGNOSIS — Z79890 Hormone replacement therapy (postmenopausal): Secondary | ICD-10-CM | POA: Insufficient documentation

## 2022-05-25 DIAGNOSIS — R1310 Dysphagia, unspecified: Secondary | ICD-10-CM | POA: Insufficient documentation

## 2022-05-25 DIAGNOSIS — Z853 Personal history of malignant neoplasm of breast: Secondary | ICD-10-CM | POA: Diagnosis not present

## 2022-05-25 DIAGNOSIS — Z923 Personal history of irradiation: Secondary | ICD-10-CM | POA: Insufficient documentation

## 2022-05-25 DIAGNOSIS — E039 Hypothyroidism, unspecified: Secondary | ICD-10-CM | POA: Diagnosis not present

## 2022-05-25 DIAGNOSIS — Z79899 Other long term (current) drug therapy: Secondary | ICD-10-CM | POA: Insufficient documentation

## 2022-05-25 DIAGNOSIS — Z9221 Personal history of antineoplastic chemotherapy: Secondary | ICD-10-CM | POA: Diagnosis not present

## 2022-05-25 HISTORY — PX: ESOPHAGEAL DILATION: SHX303

## 2022-05-25 HISTORY — PX: ESOPHAGOGASTRODUODENOSCOPY (EGD) WITH PROPOFOL: SHX5813

## 2022-05-25 SURGERY — ESOPHAGOGASTRODUODENOSCOPY (EGD) WITH PROPOFOL
Anesthesia: General | Site: Mouth

## 2022-05-25 MED ORDER — ACETAMINOPHEN 160 MG/5ML PO SOLN: 325.0000 mg | ORAL | Status: DC | PRN

## 2022-05-25 MED ORDER — LIDOCAINE HCL (CARDIAC) PF 100 MG/5ML IV SOSY
PREFILLED_SYRINGE | INTRAVENOUS | Status: DC | PRN
  Administered 2022-05-25: 50 mg via INTRAVENOUS

## 2022-05-25 MED ORDER — STERILE WATER FOR IRRIGATION IR SOLN
Status: DC | PRN
  Administered 2022-05-25: 50 mL

## 2022-05-25 MED ORDER — SODIUM CHLORIDE 0.9 % IV SOLN: INTRAVENOUS | Status: DC

## 2022-05-25 MED ORDER — LACTATED RINGERS IV SOLN: INTRAVENOUS | Status: DC

## 2022-05-25 MED ORDER — PANTOPRAZOLE SODIUM 40 MG PO TBEC: 40.0000 mg | DELAYED_RELEASE_TABLET | Freq: Every day | ORAL | 11 refills | Status: DC

## 2022-05-25 MED ORDER — ACETAMINOPHEN 325 MG PO TABS: 650.0000 mg | ORAL_TABLET | Freq: Once | ORAL | Status: DC | PRN

## 2022-05-25 MED ORDER — PROPOFOL 10 MG/ML IV BOLUS
INTRAVENOUS | Status: DC | PRN
  Administered 2022-05-25 (×2): 100 mg via INTRAVENOUS

## 2022-05-25 MED ORDER — ONDANSETRON HCL 4 MG/2ML IJ SOLN: 4.0000 mg | Freq: Once | INTRAMUSCULAR | Status: DC | PRN

## 2022-05-25 SURGICAL SUPPLY — 10 items
BALLN DILATOR 15-18 8 (BALLOONS) ×2
BALLOON DILATOR 15-18 8 (BALLOONS) IMPLANT
BLOCK BITE 60FR ADLT L/F GRN (MISCELLANEOUS) ×2 IMPLANT
GOWN CVR UNV OPN BCK APRN NK (MISCELLANEOUS) ×4 IMPLANT
GOWN ISOL THUMB LOOP REG UNIV (MISCELLANEOUS) ×4
KIT PRC NS LF DISP ENDO (KITS) ×2 IMPLANT
KIT PROCEDURE OLYMPUS (KITS) ×2
MANIFOLD NEPTUNE II (INSTRUMENTS) ×2 IMPLANT
SYR INFLATION 60ML (SYRINGE) IMPLANT
WATER STERILE IRR 250ML POUR (IV SOLUTION) ×2 IMPLANT

## 2022-05-25 NOTE — Anesthesia Postprocedure Evaluation (Signed)
Anesthesia Post Note  Patient: TABITHA RIGGINS  Procedure(s) Performed: ESOPHAGOGASTRODUODENOSCOPY (EGD) WITH PROPOFOL ESOPHAGEAL DILATION (Mouth)  Patient location during evaluation: PACU Anesthesia Type: General Level of consciousness: awake and alert, oriented and patient cooperative Pain management: pain level controlled Vital Signs Assessment: post-procedure vital signs reviewed and stable Respiratory status: spontaneous breathing, nonlabored ventilation and respiratory function stable Cardiovascular status: blood pressure returned to baseline and stable Postop Assessment: adequate PO intake Anesthetic complications: no   No notable events documented.   Last Vitals:  Vitals:   05/25/22 1049 05/25/22 1058  BP: 110/67 122/80  Pulse: 68 76  Resp: 15 (!) 21  Temp:  (!) 36.4 C  SpO2: 95% 97%    Last Pain:  Vitals:   05/25/22 1058  TempSrc:   PainSc: 0-No pain                 Darrin Nipper

## 2022-05-25 NOTE — Anesthesia Preprocedure Evaluation (Addendum)
Anesthesia Evaluation  Patient identified by MRN, date of birth, ID band Patient awake    Reviewed: Allergy & Precautions, NPO status , Patient's Chart, lab work & pertinent test results  History of Anesthesia Complications (+) PONV and history of anesthetic complications  Airway Mallampati: II   Neck ROM: Full    Dental no notable dental hx.    Pulmonary asthma    Pulmonary exam normal breath sounds clear to auscultation       Cardiovascular Exercise Tolerance: Good Normal cardiovascular exam Rhythm:Regular Rate:Normal  ECG 03/07/22:  Normal sinus rhythm Incomplete right bundle branch block   Neuro/Psych  PSYCHIATRIC DISORDERS Anxiety Depression    Chronic pain    GI/Hepatic negative GI ROS,,,  Endo/Other  Hypothyroidism    Renal/GU Renal disease (nephrolithiasis)     Musculoskeletal   Abdominal   Peds  Hematology Breast CA   Anesthesia Other Findings   Reproductive/Obstetrics                              Anesthesia Physical Anesthesia Plan  ASA: 2  Anesthesia Plan: General   Post-op Pain Management:    Induction: Intravenous  PONV Risk Score and Plan: 4 or greater and Propofol infusion, TIVA and Treatment may vary due to age or medical condition  Airway Management Planned: Natural Airway  Additional Equipment:   Intra-op Plan:   Post-operative Plan:   Informed Consent: I have reviewed the patients History and Physical, chart, labs and discussed the procedure including the risks, benefits and alternatives for the proposed anesthesia with the patient or authorized representative who has indicated his/her understanding and acceptance.       Plan Discussed with: CRNA  Anesthesia Plan Comments: (LMA/GETA backup discussed.  Patient consented for risks of anesthesia including but not limited to:  - adverse reactions to medications - damage to eyes, teeth, lips or other  oral mucosa - nerve damage due to positioning  - sore throat or hoarseness - damage to heart, brain, nerves, lungs, other parts of body or loss of life  Informed patient about role of CRNA in peri- and intra-operative care.  Patient voiced understanding.)         Anesthesia Quick Evaluation

## 2022-05-25 NOTE — Op Note (Signed)
[facility] Gastroenterology Patient Name: Carol Kirby Procedure Date: 05/25/2022 10:33 AM MRN: [id number] Account #: [id number] Date of Birth: 1950-10-06 Admit Type: Outpatient Age: 71 Room: [facility] OR ROOM 01 Gender: Female Note Status: Finalized Instrument Name: [id number] Procedure:             Upper GI endoscopy Indications:           Dysphagia Providers:             [name] MD, MD Referring MD:          [name] MD, MD (Referring MD), Lenard Simmer,                         MD (Referring MD) Medicines:             Propofol per Anesthesia Complications:         No immediate complications. Procedure:             Pre-Anesthesia Assessment:                        - Prior to the procedure, a History and Physical was                         performed, and patient medications and allergies were                         reviewed. The patient's tolerance of previous                         anesthesia was also reviewed. The risks and benefits                         of the procedure and the sedation options and risks                         were discussed with the patient. All questions were                         answered, and informed consent was obtained. Prior                         Anticoagulants: The patient has taken no anticoagulant                         or antiplatelet agents. ASA Grade Assessment: II - A                         patient with mild systemic disease. After reviewing                         the risks and benefits, the patient was deemed in                         satisfactory condition to undergo the procedure.                        After obtaining informed consent, the endoscope was  passed under direct vision. Throughout the procedure,                         the patient's blood pressure, pulse, and oxygen                         saturations were monitored continuously. The Endoscope                          was introduced through the mouth, and advanced to the                         second part of duodenum. The upper GI endoscopy was                         accomplished without difficulty. The patient tolerated                         the procedure well. Findings:      One benign-appearing, intrinsic mild stenosis was found at the       gastroesophageal junction. The stenosis was traversed. A TTS dilator was       passed through the scope. Dilation with a 15-16.5-18 mm balloon dilator       was performed to 18 mm. The dilation site was examined following       endoscope reinsertion and showed complete resolution of luminal       narrowing.      The stomach was normal.      The examined duodenum was normal. Impression:            - Benign-appearing esophageal stenosis. Dilated.                        - Normal stomach.                        - Normal examined duodenum.                        - No specimens collected. Recommendation:        - Discharge patient to home.                        - Resume previous diet.                        - Continue present medications.                        - Use a proton pump inhibitor PO daily. Procedure Code(s):     --- Professional ---                        [id number], Esophagogastroduodenoscopy, flexible,                         transoral; with transendoscopic balloon dilation of                         esophagus (less than 30 mm diameter) Diagnosis Code(s):     --- Professional ---  R13.10, Dysphagia, unspecified                        K22.2, Esophageal obstruction CPT copyright 2022 American Medical Association. All rights reserved. The codes documented in this report are preliminary and upon coder review may  be revised to meet current compliance requirements. [name] MD, MD 05/25/2022 10:46:42 AM This report has been signed electronically. Number of Addenda: 0 Note Initiated On: 05/25/2022 10:33 AM Total Procedure  Duration: 0 hours 4 minutes 27 seconds  Estimated Blood Loss:  Estimated blood loss: none.      [facility]

## 2022-05-25 NOTE — H&P (Signed)
[name], MD [facility] [address], [place], [place] [address] Phone:[phone] Fax : [phone]  Primary Care Physician:  Lenard Simmer, MD Primary Gastroenterologist:  Dr. Allen Norris  Pre-Procedure History & Physical: HPI:  Carol Kirby is a 71 y.o. female is here for an endoscopy.   Past Medical History:  Diagnosis Date   Anxiety    Asthma    Breast CA ([place]) 2016   right   Breast cancer ([place]) 7673   Complication of anesthesia    Depression    History of kidney stones    Hypothyroidism    Personal history of chemotherapy 2016   Right Breast Cancer   Personal history of radiation therapy 2016   Right Breast Cancer   PONV (postoperative nausea and vomiting)     Past Surgical History:  Procedure Laterality Date   BREAST BIOPSY Left 2001   neg   BREAST EXCISIONAL BIOPSY Left    CESAREAN SECTION     COLONOSCOPY     COLONOSCOPY WITH PROPOFOL N/A 03/05/2019   Procedure: COLONOSCOPY WITH PROPOFOL;  Surgeon: Lollie Sails, MD;  Location: [facility] ENDOSCOPY;  Service: Endoscopy;  Laterality: N/A;   ESOPHAGOGASTRODUODENOSCOPY (EGD) WITH PROPOFOL N/A 04/24/2022   Procedure: ESOPHAGOGASTRODUODENOSCOPY (EGD) WITH PROPOFOL;  Surgeon: [name], MD;  Location: [facility] ENDOSCOPY;  Service: Endoscopy;  Laterality: N/A;   MASTECTOMY PARTIAL / LUMPECTOMY Right 06/24/2015   [facility]   NASAL SEPTOPLASTY W/ TURBINOPLASTY N/A 03/14/2018   Procedure: NASAL SEPTOPLASTY WITH TURBINATE RESECTION;  Surgeon: Beverly Gust, MD;  Location: [place];  Service: ENT;  Laterality: N/A;    Prior to Admission medications   Medication Sig Start Date End Date Taking? Authorizing Provider  cyanocobalamin (,VITAMIN B-12,) 1000 MCG/ML injection Inject 1,000 mcg into the muscle every 30 (thirty) days.   Yes [provider]  folic acid (FOLVITE) 1 MG tablet Take 1 mg by mouth daily.   Yes [provider]  levothyroxine (SYNTHROID) 88 MCG tablet Take 88 mcg by  mouth daily before breakfast.   Yes [provider]  simvastatin (ZOCOR) 20 MG tablet Take 20 mg by mouth daily.   Yes [provider]  Vitamin D, Ergocalciferol, (DRISDOL) 50000 units CAPS capsule Take 50,000 Units by mouth every 30 (thirty) days.   Yes [provider]  montelukast (SINGULAIR) 10 MG tablet Take 10 mg by mouth daily. Patient not taking: Reported on 05/18/2022    [provider]  ranitidine (ZANTAC) 150 MG tablet Take 150 mg by mouth daily as needed for heartburn. Patient not taking: Reported on 04/24/2022    [provider]    Allergies as of 05/01/2022 - Review Complete 04/24/2022  Allergen Reaction Noted   Sulfa antibiotics Rash 03/12/2018    History reviewed. No pertinent family history.  Social History   Socioeconomic History   Marital status: Married    Spouse name: Not on file   Number of children: Not on file   Years of education: Not on file   Highest education level: Not on file  Occupational History   Not on file  Tobacco Use   Smoking status: Never   Smokeless tobacco: Never  Vaping Use   Vaping Use: Never used  Substance and Sexual Activity   Alcohol use: Yes    Alcohol/week: 0.0 - 1.0 standard drinks of alcohol   Drug use: Never   Sexual activity: Yes  Other Topics Concern   Not on file  Social History Narrative  Not on file   Social Determinants of Health   Financial Resource Strain: Not on file  Food Insecurity: Not on file  Transportation Needs: Not on file  Physical Activity: Not on file  Stress: Not on file  Social Connections: Not on file  Intimate Partner Violence: Not on file    Review of Systems: See HPI, otherwise negative ROS  Physical Exam: BP (!) 148/84   Pulse 70   Temp (!) 97.5 F (36.4 C) (Temporal)   Resp 14   Ht '5\' 1"'$  (1.549 m)   Wt 64.3 kg   SpO2 98%   BMI 26.79 kg/m  General:   Alert,  pleasant and cooperative in NAD Head:  Normocephalic and  atraumatic. Neck:  Supple; no masses or thyromegaly. Lungs:  Clear throughout to auscultation.    Heart:  Regular rate and rhythm. Abdomen:  Soft, nontender and nondistended. Normal bowel sounds, without guarding, and without rebound.   Neurologic:  Alert and  oriented x4;  grossly normal neurologically.  Impression/Plan: Carol Kirby is here for an endoscopy to be performed for dysphagia  Risks, benefits, limitations, and alternatives regarding  endoscopy have been reviewed with the patient.  Questions have been answered.  All parties agreeable.   [name], MD  05/25/2022, 10:05 AM

## 2022-05-25 NOTE — Transfer of Care (Signed)
Immediate Anesthesia Transfer of Care Note  Patient: Carol Kirby  Procedure(s) Performed: ESOPHAGOGASTRODUODENOSCOPY (EGD) WITH PROPOFOL ESOPHAGEAL DILATION (Mouth)  Patient Location: PACU  Anesthesia Type: General  Level of Consciousness: awake, alert  and patient cooperative  Airway and Oxygen Therapy: Patient Spontanous Breathing and Patient connected to supplemental oxygen  Post-op Assessment: Post-op Vital signs reviewed, Patient's Cardiovascular Status Stable, Respiratory Function Stable, Patent Airway and No signs of Nausea or vomiting  Post-op Vital Signs: Reviewed and stable  Complications: No notable events documented.

## 2022-05-28 ENCOUNTER — Encounter: Payer: Self-pay | Admitting: Gastroenterology

## 2023-02-25 ENCOUNTER — Other Ambulatory Visit: Payer: Self-pay | Admitting: Obstetrics and Gynecology

## 2023-02-25 DIAGNOSIS — Z1231 Encounter for screening mammogram for malignant neoplasm of breast: Secondary | ICD-10-CM

## 2023-04-16 ENCOUNTER — Ambulatory Visit
Admission: RE | Admit: 2023-04-16 | Discharge: 2023-04-16 | Disposition: A | Discharge status: Home / Self Care | Payer: Medicare HMO | Source: Ambulatory Visit | Attending: Obstetrics and Gynecology | Admitting: Obstetrics and Gynecology

## 2023-04-16 DIAGNOSIS — Z1231 Encounter for screening mammogram for malignant neoplasm of breast: Secondary | ICD-10-CM | POA: Insufficient documentation

## 2023-05-04 ENCOUNTER — Encounter: Payer: Self-pay | Admitting: Emergency Medicine

## 2023-05-04 ENCOUNTER — Ambulatory Visit
Admission: EM | Admit: 2023-05-04 | Discharge: 2023-05-04 | Disposition: A | Discharge status: Home / Self Care | Payer: Medicare HMO | Attending: Family Medicine | Admitting: Family Medicine

## 2023-05-04 DIAGNOSIS — J069 Acute upper respiratory infection, unspecified: Secondary | ICD-10-CM

## 2023-05-04 DIAGNOSIS — U071 COVID-19: Secondary | ICD-10-CM | POA: Diagnosis not present

## 2023-05-04 DIAGNOSIS — R059 Cough, unspecified: Secondary | ICD-10-CM | POA: Diagnosis present

## 2023-05-04 LAB — RESP PANEL BY RT-PCR (RSV, FLU A&B, COVID)  RVPGX2
Influenza A by PCR: NEGATIVE
Influenza B by PCR: NEGATIVE
Resp Syncytial Virus by PCR: NEGATIVE
SARS Coronavirus 2 by RT PCR: POSITIVE — AB

## 2023-05-04 MED ORDER — NIRMATRELVIR/RITONAVIR (PAXLOVID)TABLET: ORAL_TABLET | ORAL | 0 refills | Status: DC

## 2023-05-04 NOTE — ED Provider Notes (Signed)
[facility]    CSN: [id number] Arrival date & time: 05/04/23  1039      History   Chief Complaint Chief Complaint  Patient presents with   Cough    HPI Carol Kirby is a 72 y.o. female.    Cough Here for nasal congestion and cough.  She has maybe had some chills.  No fever that she is documented.  No nausea or vomiting or diarrhea.  Does hurt some when she coughs.  Symptoms began on October 24. Her husband has been sick with similar symptoms since October 22 and tested positive on October 24 for COVID.  She is allergic to sulfa     Past Medical History:  Diagnosis Date   Anxiety    Asthma    Breast CA (HCC) 2016   right   Breast cancer (HCC) 2016   Complication of anesthesia    Depression    History of kidney stones    Hypothyroidism    Personal history of chemotherapy 2016   Right Breast Cancer   Personal history of radiation therapy 2016   Right Breast Cancer   PONV (postoperative nausea and vomiting)     Patient Active Problem List   Diagnosis Date Noted   Dysphagia    Stenosis of esophagus     Past Surgical History:  Procedure Laterality Date   BREAST BIOPSY Left 2001   neg   BREAST EXCISIONAL BIOPSY Left    CESAREAN SECTION     COLONOSCOPY     COLONOSCOPY WITH PROPOFOL N/A 03/05/2019   Procedure: COLONOSCOPY WITH PROPOFOL;  Surgeon: Christena Deem, MD;  Location: [facility] ENDOSCOPY;  Service: Endoscopy;  Laterality: N/A;   ESOPHAGEAL DILATION N/A 05/25/2022   Procedure: ESOPHAGEAL DILATION;  Surgeon: Midge Minium, MD;  Location: [facility];  Service: Endoscopy;  Laterality: N/A;  Dilation:  15-54mm   ESOPHAGOGASTRODUODENOSCOPY (EGD) WITH PROPOFOL N/A 04/24/2022   Procedure: ESOPHAGOGASTRODUODENOSCOPY (EGD) WITH PROPOFOL;  Surgeon: Midge Minium, MD;  Location: [facility] ENDOSCOPY;  Service: Endoscopy;  Laterality: N/A;   ESOPHAGOGASTRODUODENOSCOPY (EGD) WITH PROPOFOL N/A 05/25/2022   Procedure: ESOPHAGOGASTRODUODENOSCOPY (EGD)  WITH PROPOFOL;  Surgeon: Midge Minium, MD;  Location: [facility] SURGERY [facility];  Service: Endoscopy;  Laterality: N/A;   MASTECTOMY PARTIAL / LUMPECTOMY Right 06/24/2015   [facility]   NASAL SEPTOPLASTY W/ TURBINOPLASTY N/A 03/14/2018   Procedure: NASAL SEPTOPLASTY WITH TURBINATE RESECTION;  Surgeon: Linus Salmons, MD;  Location: [facility];  Service: ENT;  Laterality: N/A;    OB History   No obstetric history on file.      Home Medications    Prior to Admission medications   Medication Sig Start Date End Date Taking? Authorizing Provider  cyanocobalamin (,VITAMIN B-12,) 1000 MCG/ML injection Inject 1,000 mcg into the muscle every 30 (thirty) days.   Yes [provider]  folic acid (FOLVITE) 1 MG tablet Take 1 mg by mouth daily.   Yes [provider]  levothyroxine (SYNTHROID) 88 MCG tablet Take 88 mcg by mouth daily before breakfast.   Yes [provider]  nirmatrelvir/ritonavir (PAXLOVID) 20 x 150 MG & 10 x 100MG  TABS Patient GFR is >60. Take nirmatrelvir (150 mg) two tablets twice daily for 5 days and ritonavir (100 mg) one tablet twice daily for 5 days. 05/04/23  Yes Zenia Resides, MD  simvastatin (ZOCOR) 20 MG tablet Take 20 mg by mouth daily.   Yes [provider]  Vitamin D, Ergocalciferol, (DRISDOL) 50000 units CAPS capsule Take 50,000 Units  by mouth every 30 (thirty) days.   Yes [provider]    Family History History reviewed. No pertinent family history.  Social History Social History   Tobacco Use   Smoking status: Never   Smokeless tobacco: Never  Vaping Use   Vaping status: Never Used  Substance Use Topics   Alcohol use: Yes    Alcohol/week: 0.0 - 1.0 standard drinks of alcohol   Drug use: Never     Allergies   Sulfa antibiotics   Review of Systems Review of Systems  Respiratory:  Positive for cough.      Physical Exam Triage Vital Signs ED Triage Vitals  Encounter Vitals Group     BP 05/04/23  1122 (!) 160/91     Systolic BP Percentile --      Diastolic BP Percentile --      Pulse Rate 05/04/23 1122 88     Resp 05/04/23 1122 14     Temp 05/04/23 1122 98.4 F (36.9 C)     Temp Source 05/04/23 1122 Oral     SpO2 05/04/23 1122 99 %     Weight 05/04/23 1121 145 lb (65.8 kg)     Height 05/04/23 1121 5\' 1"  (1.549 m)     Head Circumference --      Peak Flow --      Pain Score 05/04/23 1121 3     Pain Loc --      Pain Education --      Exclude from Growth Chart --    No data found.  Updated Vital Signs BP (!) 160/91 (BP Location: Left Arm)   Pulse 88   Temp 98.4 F (36.9 C) (Oral)   Resp 14   Ht 5\' 1"  (1.549 m)   Wt 65.8 kg   SpO2 99%   BMI 27.40 kg/m   Visual Acuity Right Eye Distance:   Left Eye Distance:   Bilateral Distance:    Right Eye Near:   Left Eye Near:    Bilateral Near:     Physical Exam Vitals reviewed.  Constitutional:      General: She is not in acute distress.    Appearance: She is not toxic-appearing.  HENT:     Right Ear: Tympanic membrane and ear canal normal.     Left Ear: Tympanic membrane and ear canal normal.     Nose: Congestion present.     Mouth/Throat:     Mouth: Mucous membranes are moist.     Comments: There is mild erythema of the posterior oropharynx and some clear mucus draining.  There is no asymmetry Eyes:     Extraocular Movements: Extraocular movements intact.     Conjunctiva/sclera: Conjunctivae normal.     Pupils: Pupils are equal, round, and reactive to light.  Cardiovascular:     Rate and Rhythm: Normal rate and regular rhythm.     Heart sounds: No murmur heard. Pulmonary:     Effort: Pulmonary effort is normal. No respiratory distress.     Breath sounds: No stridor. No wheezing, rhonchi or rales.  Musculoskeletal:     Cervical back: Neck supple.  Lymphadenopathy:     Cervical: No cervical adenopathy.  Skin:    Capillary Refill: Capillary refill takes less than 2 seconds.     Coloration: Skin is not  jaundiced or pale.  Neurological:     General: No focal deficit present.     Mental Status: She is alert and oriented to person, place, and time.  Psychiatric:        Behavior: Behavior normal.      UC Treatments / Results  Labs (all labs ordered are listed, but only abnormal results are displayed) Labs Reviewed  RESP PANEL BY RT-PCR (RSV, FLU A&B, COVID)  [id number] - Abnormal; Notable for the following components:      Result Value   SARS Coronavirus 2 by RT PCR POSITIVE (*)    All other components within normal limits    EKG   Radiology No results found.  Procedures Procedures (including critical care time)  Medications Ordered in UC Medications - No data to display  Initial Impression / Assessment and Plan / UC Course  I have reviewed the triage vital signs and the nursing notes.  Pertinent labs & imaging results that were available during my care of the patient were reviewed by me and considered in my medical decision making (see chart for details).     Patient wished to be discharged for me to call her with her respiratory panel results.  I notified her at 1205 that her COVID test was positive.  Flu and RSV were negative.  Her last EGFR was greater than 60 when last done in epic.   Paxlovid is sent to her pharmacy for her to take as directed on the package.    I asked her not to take her simvastatin for the next week   Final Clinical Impressions(s) / UC Diagnoses   Final diagnoses:  Viral URI with cough     Discharge Instructions      You have had a COVID/flu/RSV test.  Will result her in the next 30 minutes or so.  I will call you and send in Paxlovid if it is positive for the COVID illness.     ED Prescriptions     Medication Sig Dispense Auth. Provider   nirmatrelvir/ritonavir (PAXLOVID) 20 x 150 MG & 10 x 100MG  TABS Patient GFR is >60. Take nirmatrelvir (150 mg) two tablets twice daily for 5 days and ritonavir (100 mg) one tablet twice daily  for 5 days. 30 tablet Eldene Plocher, Janace Aris, MD      PDMP not reviewed this encounter.   Zenia Resides, MD 05/04/23 [phone]

## 2023-05-04 NOTE — Discharge Instructions (Signed)
You have had a COVID/flu/RSV test.  Will result her in the next 30 minutes or so.  I will call you and send in Paxlovid if it is positive for the COVID illness.

## 2023-05-04 NOTE — ED Triage Notes (Signed)
Patient c/o cough, chest congestion, runny nose that started on Thursday.  Patient unsure of fevers.  Patient states that her husband has COVID.

## 2023-05-20 ENCOUNTER — Ambulatory Visit: Payer: Self-pay

## 2024-03-15 IMAGING — RF DG SWALLOWING FUNCTION
1 series · 1 of 1 positions shown · non-contrast
Comparison: None.

CLINICAL DATA: Difficulty swallowing.

EXAM:
MODIFIED BARIUM SWALLOW
TECHNIQUE: Different consistencies of barium were administered orally to the
patient by the Speech Pathologist. Imaging of the pharynx was
performed in the lateral projection.
FLUOROSCOPY TIME:  1 minute, 24 seconds

[Series 1: cp_standard · 0.09mm/px · 1 of 1 slices shown]
[im 1/1]
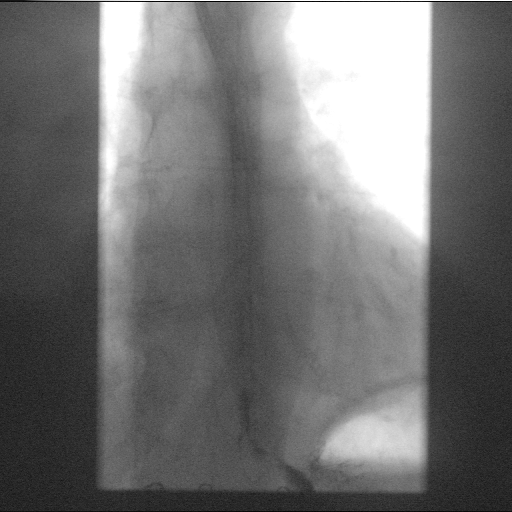

[1 of 1 positions shown; findings below may reference images not displayed]

FINDINGS: Modified barium swallow was performed by the speech pathologist.
Radiologist was not involved with this exam. Please refer to the
Speech Pathology report for results and recommendations.
IMPRESSION: Please refer to the Speech Pathologists report for complete details
and recommendations.

## 2024-03-31 ENCOUNTER — Other Ambulatory Visit: Payer: Self-pay | Admitting: Obstetrics and Gynecology

## 2024-03-31 DIAGNOSIS — Z1231 Encounter for screening mammogram for malignant neoplasm of breast: Secondary | ICD-10-CM

## 2024-04-29 ENCOUNTER — Ambulatory Visit
Admission: RE | Admit: 2024-04-29 | Discharge: 2024-04-29 | Disposition: A | Discharge status: Home / Self Care | Source: Ambulatory Visit | Attending: Obstetrics and Gynecology | Admitting: Obstetrics and Gynecology

## 2024-04-29 DIAGNOSIS — Z1231 Encounter for screening mammogram for malignant neoplasm of breast: Secondary | ICD-10-CM | POA: Insufficient documentation

## 2024-07-06 NOTE — Discharge Instructions (Signed)

## 2024-07-07 ENCOUNTER — Encounter: Payer: Self-pay | Admitting: Ophthalmology

## 2024-07-08 ENCOUNTER — Encounter: Payer: Self-pay | Admitting: Ophthalmology

## 2024-07-13 ENCOUNTER — Other Ambulatory Visit: Payer: Self-pay

## 2024-07-13 ENCOUNTER — Ambulatory Visit: Payer: Self-pay | Admitting: Anesthesiology

## 2024-07-13 ENCOUNTER — Ambulatory Visit
Admission: RE | Admit: 2024-07-13 | Discharge: 2024-07-13 | Disposition: A | Discharge status: Home / Self Care | Attending: Ophthalmology | Admitting: Ophthalmology

## 2024-07-13 ENCOUNTER — Encounter: Payer: Self-pay | Admitting: Ophthalmology

## 2024-07-13 ENCOUNTER — Encounter
Admission: RE | Disposition: A | Discharge status: Home / Self Care | Payer: Self-pay | Source: Home / Self Care | Attending: Ophthalmology

## 2024-07-13 DIAGNOSIS — H2511 Age-related nuclear cataract, right eye: Secondary | ICD-10-CM | POA: Diagnosis present

## 2024-07-13 DIAGNOSIS — I4510 Unspecified right bundle-branch block: Secondary | ICD-10-CM | POA: Insufficient documentation

## 2024-07-13 DIAGNOSIS — Z853 Personal history of malignant neoplasm of breast: Secondary | ICD-10-CM | POA: Insufficient documentation

## 2024-07-13 DIAGNOSIS — J45909 Unspecified asthma, uncomplicated: Secondary | ICD-10-CM | POA: Insufficient documentation

## 2024-07-13 DIAGNOSIS — E039 Hypothyroidism, unspecified: Secondary | ICD-10-CM | POA: Insufficient documentation

## 2024-07-13 DIAGNOSIS — G8929 Other chronic pain: Secondary | ICD-10-CM | POA: Diagnosis not present

## 2024-07-13 HISTORY — DX: Unspecified asthma, uncomplicated: J45.909

## 2024-07-13 HISTORY — DX: Gastro-esophageal reflux disease without esophagitis: K21.9

## 2024-07-13 HISTORY — PX: CATARACT EXTRACTION W/PHACO: SHX586

## 2024-07-13 SURGERY — PHACOEMULSIFICATION, CATARACT, WITH IOL INSERTION
Anesthesia: Monitor Anesthesia Care | Site: Eye | Laterality: Right

## 2024-07-13 MED ORDER — SIGHTPATH DOSE#1 BSS IO SOLN
INTRAOCULAR | Status: DC | PRN
  Administered 2024-07-13: 92 mL via OPHTHALMIC

## 2024-07-13 MED ORDER — MIDAZOLAM HCL 2 MG/2ML IJ SOLN
INTRAMUSCULAR | Status: AC
  Filled 2024-07-13: qty 2

## 2024-07-13 MED ORDER — LIDOCAINE HCL (PF) 2 % IJ SOLN
INTRAOCULAR | Status: DC | PRN
  Administered 2024-07-13: 4 mL via INTRAOCULAR

## 2024-07-13 MED ORDER — CYCLOPENTOLATE HCL 2 % OP SOLN
1.0000 [drp] | OPHTHALMIC | Status: AC | PRN
  Administered 2024-07-13 (×3): 1 [drp] via OPHTHALMIC

## 2024-07-13 MED ORDER — PHENYLEPHRINE HCL 10 % OP SOLN
1.0000 [drp] | OPHTHALMIC | Status: AC | PRN
  Administered 2024-07-13 (×3): 1 [drp] via OPHTHALMIC

## 2024-07-13 MED ORDER — MIDAZOLAM HCL (PF) 2 MG/2ML IJ SOLN
INTRAMUSCULAR | Status: DC | PRN
  Administered 2024-07-13 (×2): 1 mg via INTRAVENOUS

## 2024-07-13 MED ORDER — SIGHTPATH DOSE#1 NA HYALUR & NA CHOND-NA HYALUR IO KIT
PACK | INTRAOCULAR | Status: DC | PRN
  Administered 2024-07-13: 1 via OPHTHALMIC

## 2024-07-13 MED ORDER — SIGHTPATH DOSE#1 BSS IO SOLN
INTRAOCULAR | Status: DC | PRN
  Administered 2024-07-13: 15 mL via INTRAOCULAR

## 2024-07-13 MED ORDER — TETRACAINE HCL 0.5 % OP SOLN
OPHTHALMIC | Status: AC
  Filled 2024-07-13: qty 4

## 2024-07-13 MED ORDER — FENTANYL CITRATE (PF) 100 MCG/2ML IJ SOLN
INTRAMUSCULAR | Status: DC | PRN
  Administered 2024-07-13: 50 ug via INTRAVENOUS

## 2024-07-13 MED ORDER — MOXIFLOXACIN HCL 0.5 % OP SOLN
OPHTHALMIC | Status: DC | PRN
  Administered 2024-07-13: .2 mL via OPHTHALMIC

## 2024-07-13 MED ORDER — TETRACAINE HCL 0.5 % OP SOLN
1.0000 [drp] | OPHTHALMIC | Status: DC | PRN
  Administered 2024-07-13 (×3): 1 [drp] via OPHTHALMIC

## 2024-07-13 MED ORDER — PHENYLEPHRINE HCL 10 % OP SOLN
OPHTHALMIC | Status: AC
  Filled 2024-07-13: qty 5

## 2024-07-13 MED ORDER — CYCLOPENTOLATE HCL 2 % OP SOLN
OPHTHALMIC | Status: AC
  Filled 2024-07-13: qty 2

## 2024-07-13 MED ORDER — FENTANYL CITRATE (PF) 100 MCG/2ML IJ SOLN
INTRAMUSCULAR | Status: AC
  Filled 2024-07-13: qty 2

## 2024-07-13 SURGICAL SUPPLY — 8 items
DISSECTOR HYDRO NUCLEUS 50X22 (MISCELLANEOUS) ×1 IMPLANT
FEE CATARACT SUITE SIGHTPATH (MISCELLANEOUS) ×1 IMPLANT
GLOVE PI ULTRA LF STRL 7.5 (GLOVE) ×1 IMPLANT
GLOVE SURG SYN 6.5 PF PI BL (GLOVE) ×1 IMPLANT
GLOVE SURG SYN 8.5 PF PI BL (GLOVE) ×1 IMPLANT
LENS IOL PANO PRO 19.0 IMPLANT
NEEDLE FILTER BLUNT 18X1 1/2 (NEEDLE) ×1 IMPLANT
SYR 3ML LL SCALE MARK (SYRINGE) ×1 IMPLANT

## 2024-07-13 NOTE — Transfer of Care (Signed)
 Immediate Anesthesia Transfer of Care Note  Patient: Carol Kirby  Procedure(s) Performed: PHACOEMULSIFICATION, CATARACT, WITH IOL INSERTION 4.68 00:33.1 (Right: Eye)  Patient Location: PACU  Anesthesia Type: MAC  Level of Consciousness: awake, alert  and patient cooperative  Airway and Oxygen Therapy: Patient Spontanous Breathing  Post-op Assessment: Post-op Vital signs reviewed, Patient's Cardiovascular Status Stable, Respiratory Function Stable, Patent Airway and No signs of Nausea or vomiting  Post-op Vital Signs: Reviewed and stable  Complications: No notable events documented.

## 2024-07-13 NOTE — Anesthesia Preprocedure Evaluation (Signed)
 "                                  Anesthesia Evaluation  Patient identified by MRN, date of birth, ID band Patient awake    Reviewed: Allergy & Precautions, NPO status , Patient's Chart, lab work & pertinent test results  History of Anesthesia Complications (+) PONV and history of anesthetic complications  Airway Mallampati: II  TM Distance: >3 FB Neck ROM: Full    Dental no notable dental hx. (+) Chipped   Pulmonary neg pulmonary ROS, asthma    Pulmonary exam normal breath sounds clear to auscultation       Cardiovascular Exercise Tolerance: Good negative cardio ROS Normal cardiovascular exam Rhythm:Regular Rate:Normal  ECG 03/07/22:  Normal sinus rhythm Incomplete right bundle branch block   Neuro/Psych  PSYCHIATRIC DISORDERS Anxiety Depression    Chronic pain negative neurological ROS  negative psych ROS   GI/Hepatic negative GI ROS, Neg liver ROS,,,  Endo/Other  negative endocrine ROSHypothyroidism    Renal/GU      Musculoskeletal   Abdominal   Peds  Hematology negative hematology ROS (+) Breast CA   Anesthesia Other Findings Past Medical History: No date: Anxiety 2016: Breast CA (HCC)     Comment:  right 2016: Breast cancer (HCC) No date: Complication of anesthesia No date: Depression No date: GERD (gastroesophageal reflux disease) No date: History of kidney stones No date: Hypothyroidism No date: Hypothyroidism No date: Mild asthma 2016: Personal history of chemotherapy     Comment:  Right Breast Cancer 2016: Personal history of radiation therapy     Comment:  Right Breast Cancer No date: PONV (postoperative nausea and vomiting)  Past Surgical History: 2001: BREAST BIOPSY; Left     Comment:  neg No date: BREAST EXCISIONAL BIOPSY; Left No date: CESAREAN SECTION No date: COLONOSCOPY 03/05/2019: COLONOSCOPY WITH PROPOFOL ; N/A     Comment:  Procedure: COLONOSCOPY WITH PROPOFOL ;  Surgeon:               Gaylyn Gladis PENNER, MD;   Location: [facility] ENDOSCOPY;                Service: Endoscopy;  Laterality: N/A; 05/25/2022: ESOPHAGEAL DILATION; N/A     Comment:  Procedure: ESOPHAGEAL DILATION;  Surgeon: Jinny Carmine,               MD;  Location: [facility];  Service: Endoscopy;               Laterality: N/A;  Dilation:  15-37mm 04/24/2022: ESOPHAGOGASTRODUODENOSCOPY (EGD) WITH PROPOFOL ; N/A     Comment:  Procedure: ESOPHAGOGASTRODUODENOSCOPY (EGD) WITH               PROPOFOL ;  Surgeon: Jinny Carmine, MD;  Location: [facility]               ENDOSCOPY;  Service: Endoscopy;  Laterality: N/A; 05/25/2022: ESOPHAGOGASTRODUODENOSCOPY (EGD) WITH PROPOFOL ; N/A     Comment:  Procedure: ESOPHAGOGASTRODUODENOSCOPY (EGD) WITH               PROPOFOL ;  Surgeon: Jinny Carmine, MD;  Location: [facility]               SURGERY CNTR;  Service: Endoscopy;  Laterality: N/A; 06/24/2015: MASTECTOMY PARTIAL / LUMPECTOMY; Right     Comment:  [facility] 03/14/2018: NASAL SEPTOPLASTY W/ TURBINOPLASTY; N/A     Comment:  Procedure: NASAL SEPTOPLASTY WITH TURBINATE RESECTION;  Surgeon: Herminio Miu, MD;  Location: [facility] SURGERY               CNTR;  Service: ENT;  Laterality: N/A;  BMI    Body Mass Index: 26.89 kg/m      Reproductive/Obstetrics negative OB ROS                              Anesthesia Physical Anesthesia Plan  ASA: 2  Anesthesia Plan: MAC   Post-op Pain Management:    Induction: Intravenous  PONV Risk Score and Plan: Midazolam  and TIVA  Airway Management Planned: Natural Airway and Nasal Cannula  Additional Equipment:   Intra-op Plan:   Post-operative Plan:   Informed Consent: I have reviewed the patients History and Physical, chart, labs and discussed the procedure including the risks, benefits and alternatives for the proposed anesthesia with the patient or authorized representative who has indicated his/her understanding and acceptance.     Dental Advisory Given  Plan  Discussed with: Anesthesiologist, CRNA and Surgeon  Anesthesia Plan Comments: (Patient consented for risks of anesthesia including but not limited to:  - adverse reactions to medications - damage to eyes, teeth, lips or other oral mucosa - nerve damage due to positioning  - sore throat or hoarseness - Damage to heart, brain, nerves, lungs, other parts of body or loss of life  Patient voiced understanding and assent.)        Anesthesia Quick Evaluation  "

## 2024-07-13 NOTE — Anesthesia Postprocedure Evaluation (Signed)
"   Anesthesia Post Note  Patient: Carol Kirby  Procedure(s) Performed: PHACOEMULSIFICATION, CATARACT, WITH IOL INSERTION 4.68 00:33.1 (Right: Eye)  Patient location during evaluation: PACU Anesthesia Type: MAC Level of consciousness: awake and alert Pain management: pain level controlled Vital Signs Assessment: post-procedure vital signs reviewed and stable Respiratory status: spontaneous breathing, nonlabored ventilation, respiratory function stable and patient connected to nasal cannula oxygen Cardiovascular status: stable and blood pressure returned to baseline Postop Assessment: no apparent nausea or vomiting Anesthetic complications: no   No notable events documented.   Last Vitals:  Vitals:   07/13/24 0958 07/13/24 1002  BP: (!) 143/84 (!) 147/74  Pulse: 77 74  Resp: 17 14  Temp: (!) 36.1 C (!) 36.1 C  SpO2: 99% 97%    Last Pain:  Vitals:   07/13/24 1002  TempSrc:   PainSc: 0-No pain                 [employer] [facility] [employer]      "

## 2024-07-13 NOTE — H&P (Signed)
 [name]   Primary Care Physician:  Salli Amato, MD Ophthalmologist: Dr. Adine Novak  Pre-Procedure History & Physical: HPI:  Carol Kirby is a 74 y.o. female here for cataract surgery.   Past Medical History:  Diagnosis Date   Anxiety    Breast CA (HCC) 2016   right   Breast cancer (HCC) 2016   Complication of anesthesia    Depression    GERD (gastroesophageal reflux disease)    History of kidney stones    Hypothyroidism    Hypothyroidism    Mild asthma    Personal history of chemotherapy 2016   Right Breast Cancer   Personal history of radiation therapy 2016   Right Breast Cancer   PONV (postoperative nausea and vomiting)     Past Surgical History:  Procedure Laterality Date   BREAST BIOPSY Left 2001   neg   BREAST EXCISIONAL BIOPSY Left    CESAREAN SECTION     COLONOSCOPY     COLONOSCOPY WITH PROPOFOL  N/A 03/05/2019   Procedure: COLONOSCOPY WITH PROPOFOL ;  Surgeon: Gaylyn Gladis PENNER, MD;  Location: [facility] ENDOSCOPY;  Service: Endoscopy;  Laterality: N/A;   ESOPHAGEAL DILATION N/A 05/25/2022   Procedure: ESOPHAGEAL DILATION;  Surgeon: Jinny Carmine, MD;  Location: [facility] SURGERY CNTR;  Service: Endoscopy;  Laterality: N/A;  Dilation:  15-96mm   ESOPHAGOGASTRODUODENOSCOPY (EGD) WITH PROPOFOL  N/A 04/24/2022   Procedure: ESOPHAGOGASTRODUODENOSCOPY (EGD) WITH PROPOFOL ;  Surgeon: Jinny Carmine, MD;  Location: [facility] ENDOSCOPY;  Service: Endoscopy;  Laterality: N/A;   ESOPHAGOGASTRODUODENOSCOPY (EGD) WITH PROPOFOL  N/A 05/25/2022   Procedure: ESOPHAGOGASTRODUODENOSCOPY (EGD) WITH PROPOFOL ;  Surgeon: Jinny Carmine, MD;  Location: [facility];  Service: Endoscopy;  Laterality: N/A;   MASTECTOMY PARTIAL / LUMPECTOMY Right 06/24/2015   [facility]   NASAL SEPTOPLASTY W/ TURBINOPLASTY N/A 03/14/2018   Procedure: NASAL SEPTOPLASTY WITH TURBINATE RESECTION;  Surgeon: Herminio Miu, MD;  Location: [facility] SURGERY CNTR;  Service: ENT;  Laterality: N/A;    Prior to  Admission medications  Medication Sig Start Date End Date Taking? Authorizing Provider  cyanocobalamin (,VITAMIN B-12,) 1000 MCG/ML injection Inject 1,000 mcg into the muscle every 30 (thirty) days.   Yes [provider]  folic acid (FOLVITE) 1 MG tablet Take 1 mg by mouth daily.   Yes [provider]  levothyroxine (SYNTHROID) 88 MCG tablet Take 88 mcg by mouth daily before breakfast.   Yes [provider]  simvastatin (ZOCOR) 20 MG tablet Take 20 mg by mouth daily.   Yes [provider]  Vitamin D, Ergocalciferol, (DRISDOL) 50000 units CAPS capsule Take 50,000 Units by mouth every 30 (thirty) days.   Yes [provider]    Allergies as of 05/20/2024 - Review Complete 05/04/2023  Allergen Reaction Noted   Sulfa antibiotics Rash 03/12/2018    History reviewed. No pertinent family history.  Social History   Socioeconomic History   Marital status: Married    Spouse name: Not on file   Number of children: Not on file   Years of education: Not on file   Highest education level: Not on file  Occupational History   Not on file  Tobacco Use   Smoking status: Never   Smokeless tobacco: Never  Vaping Use   Vaping status: Never Used  Substance and Sexual Activity   Alcohol use: Yes    Alcohol/week: 0.0 - 1.0 standard drinks of alcohol   Drug use: Never   Sexual activity: Yes  Other Topics Concern   Not on file  Social History Narrative   Not on file   Social Drivers of Health   Tobacco Use: Low Risk (07/13/2024)   Patient History    Smoking Tobacco Use: Never    Smokeless Tobacco Use: Never    Passive Exposure: Not on file  Financial Resource Strain: Low Risk  (11/13/2023)   Received from [facility]   Overall Financial Resource Strain (CARDIA)    Difficulty of Paying Living Expenses: Not hard at all  Food Insecurity: No Food Insecurity (11/13/2023)   Received from [facility]   Epic    Within the  past 12 months, you worried that your food would run out before you got the money to buy more.: Never true    Within the past 12 months, the food you bought just didn't last and you didn't have money to get more.: Never true  Transportation Needs: No Transportation Needs (11/13/2023)   Received from [facility] - Transportation    In the past 12 months, has lack of transportation kept you from medical appointments or from getting medications?: No    Lack of Transportation (Non-Medical): No  Physical Activity: Not on file  Stress: Not on file  Social Connections: Not on file  Intimate Partner Violence: Not on file  Depression (EYV7-0): Not on file  Alcohol Screen: Not on file  Housing: Low Risk  (11/13/2023)   Received from [facility]   Epic    In the last 12 months, was there a time when you were not able to pay the mortgage or rent on time?: No    In the past 12 months, how many times have you moved where you were living?: 0    At any time in the past 12 months, were you homeless or living in a shelter (including now)?: No  Utilities: Not At Risk (11/13/2023)   Received from [facility] Utilities    Threatened with loss of utilities: No  Health Literacy: Not on file    Review of Systems: See HPI, otherwise negative ROS  Physical Exam: BP (!) 157/79   Pulse 76   Temp 98 F (36.7 C) (Temporal)   Resp 18   Ht 5' 2 (1.575 m)   Wt 66.7 kg   SpO2 96%   BMI 26.89 kg/m  General:   Alert, cooperative. Head:  Normocephalic and atraumatic. Respiratory:  Normal work of breathing. Cardiovascular:  NAD  Impression/Plan: Carol Kirby is here for cataract surgery.  Risks, benefits, limitations, and alternatives regarding cataract surgery have been reviewed with the patient.  Questions have been answered.  All parties agreeable.   Adine Novak, MD  07/13/2024, 8:59 AM

## 2024-07-13 NOTE — Op Note (Signed)
 OPERATIVE NOTE  Carol Kirby [id number] 07/13/2024   PREOPERATIVE DIAGNOSIS:  Nuclear sclerotic cataract right eye.  H25.11   POSTOPERATIVE DIAGNOSIS:    Nuclear sclerotic cataract right eye.     PROCEDURE:  Phacoemusification with posterior chamber intraocular lens placement of the right eye   LENS:   Implant Name Type Inv. Item Serial No. Manufacturer Lot No. LRB No. Used Action  LENS IOL PANO PRO 19.0 - [id number]  LENS IOL PANO PRO 19.0 [id number] [employer]  Right 1 Implanted       Procedures: PHACOEMULSIFICATION, CATARACT, WITH IOL INSERTION 4.68 00:33.1 (Right)  SURGEON:  Adine Novak, MD, MPH  ANESTHESIOLOGIST: Anesthesiologist: Leavy Ned, MD CRNA: Veronica Alm BROCKS, CRNA   ANESTHESIA:  Topical with tetracaine  drops augmented with 1% preservative-free intracameral lidocaine .  ESTIMATED BLOOD LOSS: less than 1 mL.   COMPLICATIONS:  None.   DESCRIPTION OF PROCEDURE:  The patient was identified in the holding room and transported to the operating room and placed in the supine position under the operating microscope.  The right eye was identified as the operative eye and it was prepped and draped in the usual sterile ophthalmic fashion.   A 1.0 millimeter clear-corneal paracentesis was made at the 10:30 position. 0.5 ml of preservative-free 1% lidocaine  with epinephrine  was injected into the anterior chamber.  The anterior chamber was filled with viscoelastic.  A 2.4 millimeter keratome was used to make a near-clear corneal incision at the 8:00 position.  A curvilinear capsulorrhexis was made with a cystotome and capsulorrhexis forceps.  Balanced salt solution was used to hydrodissect and hydrodelineate the nucleus.   Phacoemulsification was then used in stop and chop fashion to remove the lens nucleus and epinucleus.  The remaining cortex was then removed using the irrigation and aspiration handpiece. Viscoelastic was then placed into the capsular bag to distend it for  lens placement.  A lens was then injected into the capsular bag.  The remaining viscoelastic was aspirated.   Wounds were hydrated with balanced salt solution.  The anterior chamber was inflated to a physiologic pressure with balanced salt solution.   Intracameral vigamox  0.1 mL undiluted was injected into the eye and a drop placed onto the ocular surface.  No wound leaks were noted.  The patient was taken to the recovery room in stable condition without complications of anesthesia or surgery  Adine Novak 07/13/2024, 9:57 AM

## 2024-07-20 ENCOUNTER — Encounter: Payer: Self-pay | Admitting: Anesthesiology

## 2024-07-20 ENCOUNTER — Encounter: Payer: Self-pay | Admitting: Ophthalmology

## 2024-08-03 ENCOUNTER — Ambulatory Visit: Admission: RE | Admit: 2024-08-03 | Source: Home / Self Care | Admitting: Ophthalmology

## 2024-08-03 ENCOUNTER — Encounter: Admission: RE | Payer: Self-pay | Source: Home / Self Care
